# Patient Record
Sex: Female | Born: 1980 | Race: Black or African American | Hispanic: No | Marital: Single | Smoking: Never smoker
Health system: Southern US, Community
[De-identification: ages and names within clinical notes are randomized; demographics above are authoritative.]

## PROBLEM LIST (undated history)

## (undated) DIAGNOSIS — N200 Calculus of kidney: Secondary | ICD-10-CM

## (undated) DIAGNOSIS — R51 Headache: Secondary | ICD-10-CM

## (undated) DIAGNOSIS — R519 Headache, unspecified: Secondary | ICD-10-CM

## (undated) DIAGNOSIS — E7800 Pure hypercholesterolemia, unspecified: Secondary | ICD-10-CM

## (undated) DIAGNOSIS — E119 Type 2 diabetes mellitus without complications: Secondary | ICD-10-CM

## (undated) DIAGNOSIS — N939 Abnormal uterine and vaginal bleeding, unspecified: Secondary | ICD-10-CM

## (undated) DIAGNOSIS — G912 (Idiopathic) normal pressure hydrocephalus: Secondary | ICD-10-CM

## (undated) DIAGNOSIS — B999 Unspecified infectious disease: Secondary | ICD-10-CM

## (undated) DIAGNOSIS — E669 Obesity, unspecified: Secondary | ICD-10-CM

## (undated) DIAGNOSIS — L309 Dermatitis, unspecified: Secondary | ICD-10-CM

## (undated) HISTORY — DX: (Idiopathic) normal pressure hydrocephalus: G91.2

## (undated) HISTORY — DX: Calculus of kidney: N20.0

## (undated) HISTORY — DX: Dermatitis, unspecified: L30.9

## (undated) HISTORY — DX: Obesity, unspecified: E66.9

## (undated) HISTORY — DX: Abnormal uterine and vaginal bleeding, unspecified: N93.9

## (undated) HISTORY — PX: BREAST SURGERY: SHX581

## (undated) HISTORY — DX: Type 2 diabetes mellitus without complications: E11.9

---

## 1999-08-04 ENCOUNTER — Emergency Department (HOSPITAL_COMMUNITY): Admission: EM | Admit: 1999-08-04 | Discharge: 1999-08-04 | Payer: Self-pay | Admitting: *Deleted

## 1999-09-17 ENCOUNTER — Inpatient Hospital Stay (HOSPITAL_COMMUNITY): Admission: AD | Admit: 1999-09-17 | Discharge: 1999-09-17 | Payer: Self-pay | Admitting: *Deleted

## 1999-09-25 ENCOUNTER — Encounter: Admission: RE | Admit: 1999-09-25 | Discharge: 1999-09-25 | Payer: Self-pay | Admitting: Internal Medicine

## 2000-04-24 ENCOUNTER — Emergency Department (HOSPITAL_COMMUNITY): Admission: EM | Admit: 2000-04-24 | Discharge: 2000-04-24 | Payer: Self-pay | Admitting: Emergency Medicine

## 2000-04-24 ENCOUNTER — Encounter: Payer: Self-pay | Admitting: Emergency Medicine

## 2001-10-03 ENCOUNTER — Inpatient Hospital Stay (HOSPITAL_COMMUNITY): Admission: AD | Admit: 2001-10-03 | Discharge: 2001-10-03 | Payer: Self-pay | Admitting: Obstetrics & Gynecology

## 2002-03-25 ENCOUNTER — Emergency Department (HOSPITAL_COMMUNITY): Admission: EM | Admit: 2002-03-25 | Discharge: 2002-03-25 | Payer: Self-pay | Admitting: Emergency Medicine

## 2003-05-17 ENCOUNTER — Emergency Department (HOSPITAL_COMMUNITY): Admission: EM | Admit: 2003-05-17 | Discharge: 2003-05-18 | Payer: Self-pay | Admitting: Emergency Medicine

## 2003-11-25 ENCOUNTER — Emergency Department (HOSPITAL_COMMUNITY): Admission: EM | Admit: 2003-11-25 | Discharge: 2003-11-25 | Payer: Self-pay | Admitting: Emergency Medicine

## 2004-01-12 ENCOUNTER — Emergency Department (HOSPITAL_COMMUNITY): Admission: EM | Admit: 2004-01-12 | Discharge: 2004-01-12 | Payer: Self-pay | Admitting: Family Medicine

## 2004-02-13 ENCOUNTER — Ambulatory Visit (HOSPITAL_COMMUNITY): Admission: RE | Admit: 2004-02-13 | Discharge: 2004-02-13 | Payer: Self-pay | Admitting: Orthopedic Surgery

## 2004-03-14 ENCOUNTER — Encounter: Admission: RE | Admit: 2004-03-14 | Discharge: 2004-05-10 | Payer: Self-pay | Admitting: Orthopedic Surgery

## 2004-06-07 ENCOUNTER — Encounter: Admission: RE | Admit: 2004-06-07 | Discharge: 2004-06-07 | Payer: Self-pay | Admitting: Orthopedic Surgery

## 2005-01-04 ENCOUNTER — Inpatient Hospital Stay (HOSPITAL_COMMUNITY): Admission: AD | Admit: 2005-01-04 | Discharge: 2005-01-04 | Payer: Self-pay | Admitting: Obstetrics and Gynecology

## 2005-01-09 ENCOUNTER — Ambulatory Visit: Payer: Self-pay | Admitting: Obstetrics and Gynecology

## 2005-11-10 ENCOUNTER — Emergency Department (HOSPITAL_COMMUNITY): Admission: EM | Admit: 2005-11-10 | Discharge: 2005-11-10 | Payer: Self-pay | Admitting: Emergency Medicine

## 2006-11-01 ENCOUNTER — Emergency Department (HOSPITAL_COMMUNITY): Admission: EM | Admit: 2006-11-01 | Discharge: 2006-11-01 | Payer: Self-pay | Admitting: Emergency Medicine

## 2006-12-04 ENCOUNTER — Emergency Department (HOSPITAL_COMMUNITY): Admission: EM | Admit: 2006-12-04 | Discharge: 2006-12-04 | Payer: Self-pay | Admitting: Family Medicine

## 2007-05-12 ENCOUNTER — Emergency Department (HOSPITAL_COMMUNITY): Admission: EM | Admit: 2007-05-12 | Discharge: 2007-05-13 | Payer: Self-pay | Admitting: Emergency Medicine

## 2007-05-14 ENCOUNTER — Emergency Department (HOSPITAL_COMMUNITY): Admission: EM | Admit: 2007-05-14 | Discharge: 2007-05-14 | Payer: Self-pay | Admitting: Family Medicine

## 2008-10-20 HISTORY — PX: REDUCTION MAMMAPLASTY: SUR839

## 2009-10-04 ENCOUNTER — Emergency Department (HOSPITAL_COMMUNITY): Admission: EM | Admit: 2009-10-04 | Discharge: 2009-10-04 | Payer: Self-pay | Admitting: Family Medicine

## 2009-10-24 ENCOUNTER — Other Ambulatory Visit: Admission: RE | Admit: 2009-10-24 | Discharge: 2009-10-24 | Payer: Self-pay | Admitting: Family Medicine

## 2009-10-24 ENCOUNTER — Ambulatory Visit: Payer: Self-pay | Admitting: Family Medicine

## 2010-03-06 ENCOUNTER — Encounter (INDEPENDENT_AMBULATORY_CARE_PROVIDER_SITE_OTHER): Payer: Self-pay | Admitting: *Deleted

## 2010-04-08 ENCOUNTER — Ambulatory Visit (HOSPITAL_BASED_OUTPATIENT_CLINIC_OR_DEPARTMENT_OTHER): Admission: RE | Admit: 2010-04-08 | Discharge: 2010-04-08 | Payer: Self-pay | Admitting: *Deleted

## 2010-04-13 ENCOUNTER — Ambulatory Visit: Payer: Self-pay | Admitting: Internal Medicine

## 2010-07-20 HISTORY — PX: BREAST REDUCTION SURGERY: SHX8

## 2010-08-16 ENCOUNTER — Ambulatory Visit (HOSPITAL_BASED_OUTPATIENT_CLINIC_OR_DEPARTMENT_OTHER): Admission: RE | Admit: 2010-08-16 | Discharge: 2010-08-17 | Payer: Self-pay | Admitting: Specialist

## 2010-11-19 NOTE — Letter (Signed)
Summary: New Patient letter  [facility]  [address] [place], [place] [address]   Phone: [phone]  Fax: [phone]       03/06/2010 MRN: [id number]  [facility] [address] [place], [place]  [address]  Dear Ms. Macy,  Welcome to the Gastroenterology Division at [facility].    You are scheduled to see Dr.  Russella Dar on  05-01-10 at 3pm on the 3rd floor at [facility], [address]  We ask that you try to arrive at our office 15 minutes prior to your appointment time to allow for check-in.  We would like you to complete the enclosed self-administered evaluation form prior to your visit and bring it with you on the day of your appointment.  We will review it with you.  Also, please bring a complete list of all your medications or, if you prefer, bring the medication bottles and we will list them.  Please bring your insurance card so that we may make a copy of it.  If your insurance requires a referral to see a specialist, please bring your referral form from your primary care physician.  Co-payments are due at the time of your visit and may be paid by cash, check or credit card.     Your office visit will consist of a consult with your physician (includes a physical exam), any laboratory testing he/she may order, scheduling of any necessary diagnostic testing (e.g. x-ray, ultrasound, CT-scan), and scheduling of a procedure (e.g. Endoscopy, Colonoscopy) if required.  Please allow enough time on your schedule to allow for any/all of these possibilities.    If you cannot keep your appointment, please call [phone] to cancel or reschedule prior to your appointment date.  This allows [place] the opportunity to schedule an appointment for another patient in need of care.  If you do not cancel or reschedule by 5 p.m. the business day prior to your appointment date, you will be charged a $50.00 late cancellation/no-show fee.    Thank you for choosing [facility] for your medical needs.  We appreciate the opportunity to care for you.  Please visit [place] at our website  to learn more about our practice.                     Sincerely,                                                             The Gastroenterology Division

## 2011-01-01 LAB — DIFFERENTIAL
Basophils Relative: 0 % (ref 0–1)
Eosinophils Relative: 2 % (ref 0–5)
Lymphocytes Relative: 60 % — ABNORMAL HIGH (ref 12–46)
Monocytes Absolute: 0.5 10*3/uL (ref 0.1–1.0)
Monocytes Relative: 8 % (ref 3–12)
Neutro Abs: 2.2 10*3/uL (ref 1.7–7.7)

## 2011-01-01 LAB — BASIC METABOLIC PANEL
BUN: 10 mg/dL (ref 6–23)
CO2: 25 mEq/L (ref 19–32)
Calcium: 9.5 mg/dL (ref 8.4–10.5)
Chloride: 105 mEq/L (ref 96–112)
Creatinine, Ser: 0.87 mg/dL (ref 0.4–1.2)
GFR calc Af Amer: >60 mL/min (ref >60)
GFR calc non Af Amer: >60 mL/min (ref >60)
Glucose, Bld: 85 mg/dL (ref 70–99)
Potassium: 4.2 mEq/L (ref 3.5–5.1)
Sodium: 140 mEq/L (ref 135–145)

## 2011-01-01 LAB — CBC
HCT: 41.3 % (ref 36.0–46.0)
Hemoglobin: 13.1 g/dL (ref 12.0–15.0)
MCH: 28 pg (ref 26.0–34.0)
MCHC: 31.7 g/dL (ref 30.0–36.0)
MCV: 88.2 fL (ref 78.0–100.0)
Platelets: 345 10*3/uL (ref 150–400)
RBC: 4.68 MIL/uL (ref 3.87–5.11)
RDW: 12.8 % (ref 11.5–15.5)
WBC: 7.2 10*3/uL (ref 4.0–10.5)

## 2011-01-20 LAB — WET PREP, GENITAL
Clue Cells Wet Prep HPF POC: NONE SEEN
Trich, Wet Prep: NONE SEEN

## 2011-01-20 LAB — POCT URINALYSIS DIP (DEVICE)
Protein, ur: NEGATIVE mg/dL
Specific Gravity, Urine: 1.015 (ref 1.005–1.030)
Urobilinogen, UA: 1 mg/dL (ref 0.0–1.0)
pH: 6.5 (ref 5.0–8.0)

## 2011-01-20 LAB — POCT PREGNANCY, URINE: Preg Test, Ur: NEGATIVE

## 2011-02-19 ENCOUNTER — Other Ambulatory Visit: Payer: Self-pay | Admitting: Family Medicine

## 2011-02-19 ENCOUNTER — Encounter: Payer: Self-pay | Admitting: Family Medicine

## 2011-02-19 ENCOUNTER — Encounter (INDEPENDENT_AMBULATORY_CARE_PROVIDER_SITE_OTHER): Payer: 59 | Admitting: Family Medicine

## 2011-02-19 DIAGNOSIS — Z3009 Encounter for other general counseling and advice on contraception: Secondary | ICD-10-CM

## 2011-02-19 DIAGNOSIS — N6312 Unspecified lump in the right breast, upper inner quadrant: Secondary | ICD-10-CM

## 2011-02-19 DIAGNOSIS — E669 Obesity, unspecified: Secondary | ICD-10-CM

## 2011-02-19 DIAGNOSIS — Z0000 Encounter for general adult medical examination without abnormal findings: Secondary | ICD-10-CM

## 2011-02-20 ENCOUNTER — Other Ambulatory Visit (HOSPITAL_COMMUNITY)
Admission: RE | Admit: 2011-02-20 | Discharge: 2011-02-20 | Disposition: A | Discharge status: Home / Self Care | Payer: 59 | Source: Ambulatory Visit | Attending: Family Medicine | Admitting: Family Medicine

## 2011-02-20 DIAGNOSIS — Z124 Encounter for screening for malignant neoplasm of cervix: Secondary | ICD-10-CM | POA: Insufficient documentation

## 2011-02-21 ENCOUNTER — Other Ambulatory Visit: Payer: 59

## 2011-02-24 ENCOUNTER — Encounter: Payer: Self-pay | Admitting: Family Medicine

## 2011-02-25 ENCOUNTER — Other Ambulatory Visit: Payer: 59

## 2011-03-03 ENCOUNTER — Encounter: Payer: Self-pay | Admitting: Medical

## 2011-03-03 ENCOUNTER — Ambulatory Visit (INDEPENDENT_AMBULATORY_CARE_PROVIDER_SITE_OTHER): Payer: 59 | Admitting: Medical

## 2011-03-03 VITALS — BP 110/78 | HR 80 | Ht 65.0 in | Wt 221.0 lb

## 2011-03-03 DIAGNOSIS — L03113 Cellulitis of right upper limb: Secondary | ICD-10-CM

## 2011-03-03 DIAGNOSIS — B373 Candidiasis of vulva and vagina: Secondary | ICD-10-CM

## 2011-03-03 DIAGNOSIS — IMO0002 Reserved for concepts with insufficient information to code with codable children: Secondary | ICD-10-CM

## 2011-03-03 MED ORDER — FLUCONAZOLE 100 MG PO TABS: ORAL_TABLET | ORAL | Status: AC

## 2011-03-03 MED ORDER — DOXYCYCLINE HYCLATE 50 MG PO CAPS: ORAL_CAPSULE | ORAL | Status: DC

## 2011-03-03 NOTE — Progress Notes (Signed)
Subjective:   Here today with a complaint of a possible insect bite of her right upper arm. She noticed a hard spot on the back of her right upper arm on Saturday, and since then the area has become warm, tender, indurated. She has been using an ice pack but no other relieving factors. She denies prior similar episodes. She does note that her and her mother have both gotten some recent bug bites in general. No other new complaints.   Review of Systems Constitutional: denies fever, chills, sweats Dermatology: +redness and rash right upper arm Gastroenterology: denies abdominal pain, nausea, vomiting, diarrhea Hematology: denies bleeding or bruising problems Musculoskeletal: denies arthralgias, myalgias, joint swelling     Objective:   Physical Exam Gen.: Well-developed, well-nourished, no acute distress, American female, overweight Skin: Right upper arm with a large oval area of warmth and slight swelling that measures 17 cm x 15 cm, no obvious induration or fluctuance. Otherwise skin unremarkable  Lymph nodes: No axillary lymphadenopathy   Assessment & Plan:    Encounter Diagnoses  Name Primary?  . Cellulitis of arm, right Yes  . Yeast vaginitis    Cellulitis-advised warm compresses, prescription for doxycycline today, discussed wound care and signs of worsening infection.  She will call if not improving or worse within the next 24-36 hrs.  Discussed the results of her recent Pap smear from Dr. Lynelle Doctor which were normal except for fungal organisms consistent with Candida. Prescription for Diflucan given today, and she can use a second dose when she finishes the doxycycline if needed.  Return when necessary.

## 2011-03-03 NOTE — Patient Instructions (Signed)
Cellulitis Cellulitis is an infection of the tissue under the skin. The infected area is usually red and tender. This is caused by germs (bacteria). These germs enter the body through cuts or sores. This usually happens in the arms or lower legs.  HOME CARE  Take medicine (antibiotics) the way your doctor told you to. Take it until it is gone.   Only take over-the-counter medicine as told by your doctor.   If the infection is on the arm or leg, keep the limb raised.   Use a warm cloth on the infected area several times a day to:   Help with pain.   Help with healing.   See your doctor for a follow-up visit as told.  GET HELP RIGHT AWAY IF:   You or your child is tired or confused.   You or your child is throwing up (vomiting).   You or your child is having watery poop (diarrhea).   You or your child feels ill and has muscle aches.   You or your child has a temperature by mouth above 101, not controlled by medicine.   Your baby is older than 3 months with a rectal temperature of 102 F (38.9 C) or higher.   Your baby is 59 months old or younger with a rectal temperature of 100.4 F (38 C) or higher.  MAKE SURE YOU:   Understand these instructions.   Will watch this condition.   Will get help right away if you or your child is not doing well or gets worse.  Document Released: 03/24/2008 Document Re-Released: 12/31/2009 [facility] Patient Information 2011 [place], [place].

## 2011-03-06 ENCOUNTER — Other Ambulatory Visit: Payer: 59

## 2011-03-07 NOTE — Group Therapy Note (Signed)
[name], [name]               ACCOUNT NO.:  [id number]   MEDICAL RECORD NO.:  [id number]          PATIENT TYPE:  WOC   LOCATION:  [facility]                   FACILITY:  [facility]   PHYSICIAN:  Argentina Donovan, MD        DATE OF BIRTH:  1980/12/02   DATE OF SERVICE:  01/09/2005                                    CLINIC NOTE   REASON FOR VISIT:  The patient is a 30 year old nulligravida black female  who weighs 208 pounds and is 5 feet 3 inches with a normal blood pressure  who has never had really regular periods but has not had a long period of  amenorrhea. She has very slight hirsutism with acne and a female escutcheon  but no facial hair of note. She has had almost continual spotting or  bleeding since the beginning of January and probably has a polycystic  ovarian syndrome. We have discussed in detail the physiology of  menstruation, as well as polycystic ovaries, and she had been placed when  seen in the MAU with a normal hemoglobin on Yasmin oral contraceptives just  a few days ago which have already started to control her bleeding. We are  going to give her a renewal for that up to a year. A Pap smear was done  today.   EXAMINATION:  The external genitalia is normal, BUS within normal limits.  The vagina is clean and well rugated. The cervix is clean and parous. The  uterus and adnexa could not be outlined because of the habitus of the  patient. She had a negative culture for gonorrhea and chlamydia and her wet  prep was normal as well as her urine in the MAU.   IMPRESSION:  Menstrual irregularity, probably polycystic ovarian syndrome.      PR/MEDQ  D:  01/09/2005  T:  01/09/2005  Job:  [id number]

## 2011-03-12 ENCOUNTER — Telehealth: Payer: Self-pay | Admitting: Family Medicine

## 2011-03-12 ENCOUNTER — Ambulatory Visit
Admission: RE | Admit: 2011-03-12 | Discharge: 2011-03-12 | Disposition: A | Discharge status: Home / Self Care | Payer: 59 | Source: Ambulatory Visit | Attending: Family Medicine | Admitting: Family Medicine

## 2011-03-12 DIAGNOSIS — N6312 Unspecified lump in the right breast, upper inner quadrant: Secondary | ICD-10-CM

## 2011-03-12 DIAGNOSIS — L0390 Cellulitis, unspecified: Secondary | ICD-10-CM

## 2011-03-12 NOTE — Telephone Encounter (Signed)
PT STOPPED BY, CELLULITIS IS BETTER, BUT HAVING OTHER AREAS OF REDNESS AND SWELLING, THEY DON'T HAVE THE HEAT LIKE THE CELLULITIS BUT DO ITCH.  PLEASE CALL PT-LM

## 2011-03-13 MED ORDER — DOXYCYCLINE HYCLATE 100 MG PO TABS: 100.0000 mg | ORAL_TABLET | Freq: Two times a day (BID) | ORAL | Status: AC

## 2011-03-13 NOTE — Telephone Encounter (Signed)
Lets go additional period of time on higher dose.  Please call out Doxycycline 100mg  tablet, 1 tablet po BID x 10 days #20, no refill.  pls put this in as an order as well and close.

## 2011-03-13 NOTE — Telephone Encounter (Signed)
Put in Doxycycline 100 mg 1 po BID x 10 days #20 with 0 refill., sent to pharmacy.    Patient notified. CM, LPN

## 2011-03-14 ENCOUNTER — Telehealth: Payer: Self-pay | Admitting: *Deleted

## 2011-03-14 NOTE — Telephone Encounter (Signed)
Left message for patient explaining mammo results. Instructed her to call with any questions.

## 2011-03-19 ENCOUNTER — Other Ambulatory Visit: Payer: 59

## 2011-05-27 ENCOUNTER — Telehealth: Payer: Self-pay | Admitting: *Deleted

## 2011-05-27 NOTE — Telephone Encounter (Signed)
There is no generic for Yaz.  I am out of town and do not have access to her chart to know if she has done well with a different OCP in the past.  Please ask pt if she has done well with a particular OCP (generic) in the past that she would like to be changed to; and/or if this can wait for my return (she had recent CPE, but prior to EMR).  I believe that at some point insurances won't be able to charge copays (part of the new affordable care act) for birth control, but not exactly sure when/how that goes into effect.

## 2011-05-28 NOTE — Telephone Encounter (Signed)
Left message for pt to return call. CM, LPN

## 2011-05-29 NOTE — Telephone Encounter (Signed)
Spoke with pt regarding birth control.  She has been on a generic in the past but not sure what it was.  Pt also stated that she can wait until you return on Monday to look at her chart.  Thanks, CM, LPN

## 2011-06-02 ENCOUNTER — Telehealth: Payer: Self-pay | Admitting: *Deleted

## 2011-06-02 NOTE — Telephone Encounter (Signed)
No records of other OCP's in chart.  Change to ortho tricyclen, as this is available in generic, and is well tolerated.  If cycles aren't well regulated after 3 months of being on this pill, f/u to discuss other pills.  Keep track of any irregular bleeding on a calendar and bring to visit.

## 2011-06-02 NOTE — Telephone Encounter (Signed)
Left message for patient to return my call re: birth control pills.

## 2011-06-03 ENCOUNTER — Telehealth: Payer: Self-pay | Admitting: Family Medicine

## 2011-06-03 DIAGNOSIS — IMO0001 Reserved for inherently not codable concepts without codable children: Secondary | ICD-10-CM

## 2011-06-03 MED ORDER — NORGESTIM-ETH ESTRAD TRIPHASIC 0.18/0.215/0.25 MG-35 MCG PO TABS: 1.0000 | ORAL_TABLET | Freq: Every day | ORAL | Status: DC

## 2011-06-03 NOTE — Telephone Encounter (Signed)
Called ortho tri-cyclen to [facility] at [phone] per Joselyn Arrow.  CM, LPN

## 2011-06-03 NOTE — Telephone Encounter (Signed)
Pt is aware of message and called out orth tri-cyclen to pharmacy.

## 2011-06-06 ENCOUNTER — Other Ambulatory Visit: Payer: Self-pay | Admitting: *Deleted

## 2011-06-30 ENCOUNTER — Telehealth: Payer: Self-pay | Admitting: *Deleted

## 2011-06-30 NOTE — Telephone Encounter (Signed)
Patient called and states that she has been off and on all month(her menstrual cycle). States that today she is having some pain, no more than usual but she is having sever bleeding, and passing many quarter sized blood clots. She is using super absorbant tampons and changing every two hours with a pad as a backup. She hasn't started bcp's as of yet. Please advise. Thanks.

## 2011-06-30 NOTE — Telephone Encounter (Signed)
OV is recommended. Ensure adequate hydration/drink plenty of fluids

## 2011-06-30 NOTE — Telephone Encounter (Signed)
Left message for patient to schedule OV visit to see Dr.Knapp. Left message offering appointment today at 3:15pm, patient has not yet returned my call.

## 2011-07-02 ENCOUNTER — Encounter: Payer: Self-pay | Admitting: Family Medicine

## 2011-07-02 ENCOUNTER — Ambulatory Visit (INDEPENDENT_AMBULATORY_CARE_PROVIDER_SITE_OTHER): Payer: 59 | Admitting: Family Medicine

## 2011-07-02 VITALS — BP 120/70 | HR 80 | Ht 64.0 in | Wt 219.0 lb

## 2011-07-02 DIAGNOSIS — N920 Excessive and frequent menstruation with regular cycle: Secondary | ICD-10-CM

## 2011-07-02 DIAGNOSIS — Z309 Encounter for contraceptive management, unspecified: Secondary | ICD-10-CM

## 2011-07-02 LAB — POCT HEMOGLOBIN: Hemoglobin: 11.7

## 2011-07-02 MED ORDER — IBUPROFEN 800 MG PO TABS: 800.0000 mg | ORAL_TABLET | Freq: Three times a day (TID) | ORAL | Status: AC | PRN

## 2011-07-02 MED ORDER — NORETHINDRONE-ETH ESTRADIOL 0.5-35 MG-MCG PO TABS: 1.0000 | ORAL_TABLET | Freq: Every day | ORAL | Status: DC

## 2011-07-02 NOTE — Progress Notes (Signed)
Patient presents with complaint of irregular menstrual bleeding, heavy and clotting currently.  Hadn't been on any birth control for about 6 months. Cycles were regular during this time. Early in August, started spotting, and sometimes having thick white discharge, and brown discharge.  She started taking Necon 1-35 BCP's that she had at home, but only took it until the bleeding stopped (just a few days), but then when stopped the pills, the bleeding recurred.  Then she'd restart the pills.  Was taking the pills on and off, and having bleeding on and off.  Last regular period was end of July.  This bleeding/spotting started about 2 weeks later.   She took last pill of OCP's on Friday, and then on Sunday starting bleeding heavily.  Bleeding has been heavy since then, with clotting, and soaking through tampons every 2-3 hours.  Denies feeling light headed or dizzy.  Denies any potential pregnancy--last intercourse was in May.  Having some abdominal pain, cramping; ibuprofen eases it some.  Past Medical History  Diagnosis Date  . Obesity, unspecified   . Menses, irregular   . Contraceptive management   . Eczema     sees dr Terri Piedra    Past Surgical History  Procedure Date  . Breast reduction surgery 07/2010    History   Social History  . Marital Status: Single    Spouse Name: N/A    Number of Children: N/A  . Years of Education: N/A   Occupational History  . Not on file.   Social History Main Topics  . Smoking status: Never Smoker   . Smokeless tobacco: Never Used  . Alcohol Use: No  . Drug Use: No  . Sexually Active: Not on file   Other Topics Concern  . Not on file   Social History Narrative  . No narrative on file   Meds--Necon 1-35 irregularly per HPI  No Known Allergies  ROS: Sweaty and hot and stomach upset this morning (diarrhea).  Denies fevers, nausea, vomiting, current vaginal discharge, urinary complaints, or other concerns  PHYSICAL EXAM: BP 120/70  Pulse 80   Ht 5\' 4"  (1.626 m)  Wt 219 lb (99.338 kg)  BMI 37.59 kg/m2  LMP 06/29/2011 Pleasant obese female in no distress Heart: regular rate and rhythm without murmur Lungs: clear bilaterally Abdomen: soft, no masses.  Mild tenderness across lower abdomen  Hg 11.7  ASSESSMENT/PLAN: 1. Heavy menstrual bleeding  POCT hemoglobin, ibuprofen (ADVIL,MOTRIN) 800 MG tablet  2. Encounter for contraceptive management  norethindrone-ethinyl estradiol (NECON,BREVICON,MODICON) 0.5-35 MG-MCG tablet   Keep well hydrated.  If bleeding doesn't slow down and stop over the next week, then start the birth control pills.  Instructed to take them daily, for the entire pack  (not just for a few days until the bleeding stops).    Use condoms with any new sexual partners.  If doesn't need to start pills right away, but decides to start them in the future, when in a new sexual relationship, reminded to use condoms, educated how/when to start, etc.  F/u if ongoing problems with either abnormal bleeding or pelvic pain.  Rx ibuprofen for cramping/pain.  NSAID precautions reviewed

## 2011-07-02 NOTE — Patient Instructions (Addendum)
Keep yourself well hydrated.  If bleeding doesn't slow down and stop over the next week, then start the birth control pills.  You will need to take them daily, for the entire pack  (not just for a few days until the bleeding stops).    Use condoms with any new sexual partners.  If you don't end up starting the pills right away, make sure you remember to start AFTER your period has BEGUN, and that the first month that you take the pills, the pills are NOT EFFECTIVE in preventing pregnancy  Do not mix prescription anti-inflammatory with other OTC pain medications (tylenol is okay).  Make sure you take with food, and cut back on dose if having upper abdominal pain

## 2011-08-04 LAB — CBC
Platelets: 350
WBC: 10.8 — ABNORMAL HIGH

## 2011-08-04 LAB — BASIC METABOLIC PANEL
BUN: 4 — ABNORMAL LOW
Calcium: 8.6
Creatinine, Ser: 0.88
GFR calc non Af Amer: >60

## 2011-08-04 LAB — POCT CARDIAC MARKERS
CKMB, poc: <1 — ABNORMAL LOW
Myoglobin, poc: 50.1
Operator id: 4661

## 2011-08-04 LAB — D-DIMER, QUANTITATIVE: D-Dimer, Quant: 0.33

## 2011-08-13 ENCOUNTER — Telehealth: Payer: Self-pay | Admitting: *Deleted

## 2011-08-13 NOTE — Telephone Encounter (Signed)
Left message for patient informing her that Dr.Knapp would like her to stay on current pill pack for 3 months before making any changes(as long as she isn't having any other side effects or problems). Instructed patient to call with any further questions.

## 2011-08-13 NOTE — Telephone Encounter (Signed)
Patient called and said that she is currently taking the BCP's that you rx'd for her. Her period is still really heavy and she is still having clots. Should she continue taking for some more time or did you want to change? Please advise.

## 2011-08-13 NOTE — Telephone Encounter (Signed)
I would like for her to stay on current pill pack for 3 months before making any changes (as long as she isn't having other side effects or problems).  Continue to take daily

## 2011-08-21 DIAGNOSIS — G912 (Idiopathic) normal pressure hydrocephalus: Secondary | ICD-10-CM

## 2011-08-21 HISTORY — DX: (Idiopathic) normal pressure hydrocephalus: G91.2

## 2011-09-04 ENCOUNTER — Other Ambulatory Visit: Payer: Self-pay | Admitting: Diagnostic Neuroimaging

## 2011-10-02 ENCOUNTER — Ambulatory Visit
Admission: RE | Admit: 2011-10-02 | Discharge: 2011-10-02 | Disposition: A | Discharge status: Home / Self Care | Payer: 59 | Source: Ambulatory Visit | Attending: Diagnostic Neuroimaging | Admitting: Diagnostic Neuroimaging

## 2011-10-02 ENCOUNTER — Other Ambulatory Visit: Payer: 59

## 2011-10-02 NOTE — Patient Instructions (Signed)
Lumbar Puncture Discharge Instructions ° °1. Go home and rest quietly for the next 24 hours.  It is important to lie flat for the next 24 hours.  Get up only to go to the restroom.  You may lie in the bed or on a couch on your back, your stomach, your left side or your right side.  You may have one pillow under your head.  You may have pillows between your knees while you are on your side or under your knees while you are on your back. ° °2. DO NOT drive today.  Recline the seat as far back as it will go, while still wearing your seat belt, on the way home. ° °3. You may get up to go to the bathroom as needed.  You may sit up for 10 minutes to eat.  You may resume your normal diet and medications unless otherwise indicated. ° °4. The incidence of headache, nausea, or vomiting is about 5% (one in 20 patients).  If you develop a headache, lie flat and drink plenty of fluids until the headache goes away.  Caffeinated beverages may be helpful.  If you develop severe nausea and vomiting or a headache that does not go away with flat bed rest, call [phone]. ° °5. You may resume normal activities after your 24 hours of bed rest is over; however, do not exert yourself strongly or do any heavy lifting tomorrow. ° °6. Call your physician for a follow-up appointment.  The results of your myelogram will be sent directly to your physician by the following day. ° °7. If you have any questions or if complications develop after you arrive home, please call [phone]. ° °Discharge instructions have been explained to the patient.  The patient, or the person responsible for the patient, fully understands these instructions.  °

## 2011-10-20 ENCOUNTER — Ambulatory Visit (INDEPENDENT_AMBULATORY_CARE_PROVIDER_SITE_OTHER): Payer: 59 | Admitting: Medical

## 2011-10-20 ENCOUNTER — Encounter: Payer: Self-pay | Admitting: Medical

## 2011-10-20 VITALS — BP 138/84 | HR 72 | Temp 98.2°F | Ht 64.0 in | Wt 211.0 lb

## 2011-10-20 DIAGNOSIS — R05 Cough: Secondary | ICD-10-CM

## 2011-10-20 DIAGNOSIS — J329 Chronic sinusitis, unspecified: Secondary | ICD-10-CM | POA: Insufficient documentation

## 2011-10-20 DIAGNOSIS — R059 Cough, unspecified: Secondary | ICD-10-CM | POA: Insufficient documentation

## 2011-10-20 MED ORDER — AMOXICILLIN 500 MG PO CAPS: ORAL_CAPSULE | ORAL | Status: DC

## 2011-10-20 NOTE — Progress Notes (Signed)
Stacey Byrd is a 30 y.o. female who presents for 9 day history of cough, sore throat, sinus pressure, sneezing, runny nose, mucoid nasal drainage with some blood mixed in, using NyQuil and daytime congestion medication.  Denies sick contacts.  No other aggravating or relieving factors.  No other c/o.  Past Medical History  Diagnosis Date  . Obesity, unspecified   . Menses, irregular   . Contraceptive management   . Eczema     sees dr Terri Piedra    Objective:   Filed Vitals:   10/20/11 0911  BP: 138/84  Pulse: 72  Temp: 98.2 F (36.8 C)    General appearance: Alert, WD/WN, no distress                             Skin: warm, no rash                           Head: + maxillary sinus tenderness,                            Eyes: conjunctiva normal, corneas clear, PERRLA                            Ears: pearly TMs, external ear canals normal                          Nose: septum midline, turbinates swollen, with erythema and clear discharge             Mouth/throat: MMM, tongue normal, mild pharyngeal erythema                           Neck: supple, no adenopathy, no thyromegaly, nontender                          Heart: RRR, normal S1, S2, no murmurs                         Lungs: CTA bilaterally, no wheezes, rales, or rhonchi      Assessment and Plan:   Encounter Diagnoses  Name Primary?  . Sinusitis Yes  . Cough    Prescription given for Amoxicillin.  Can c/t OTC decongestant for congestion and cough.  Tylenol or Ibuprofen OTC for fever and malaise.  Discussed symptomatic relief, nasal saline, and call or return if worse or not improving in 2-3 days.

## 2011-10-20 NOTE — Patient Instructions (Signed)

## 2011-10-21 DIAGNOSIS — N200 Calculus of kidney: Secondary | ICD-10-CM

## 2011-10-21 HISTORY — DX: Calculus of kidney: N20.0

## 2011-10-27 ENCOUNTER — Telehealth: Payer: Self-pay | Admitting: Family Medicine

## 2011-10-27 ENCOUNTER — Other Ambulatory Visit: Payer: Self-pay | Admitting: Medical

## 2011-10-27 MED ORDER — FLUCONAZOLE 150 MG PO TABS: 150.0000 mg | ORAL_TABLET | Freq: Once | ORAL | Status: AC

## 2011-10-27 NOTE — Telephone Encounter (Signed)
Diflucan sent for yeast infection.

## 2011-10-27 NOTE — Telephone Encounter (Signed)
Left message on pt voicemail stating it was sent in

## 2012-02-16 ENCOUNTER — Emergency Department (HOSPITAL_COMMUNITY): Payer: 59

## 2012-02-16 ENCOUNTER — Encounter (HOSPITAL_COMMUNITY): Payer: Self-pay | Admitting: Emergency Medicine

## 2012-02-16 ENCOUNTER — Emergency Department (HOSPITAL_COMMUNITY)
Admission: EM | Admit: 2012-02-16 | Discharge: 2012-02-16 | Disposition: A | Discharge status: Home / Self Care | Payer: 59 | Attending: Emergency Medicine | Admitting: Emergency Medicine

## 2012-02-16 DIAGNOSIS — R109 Unspecified abdominal pain: Secondary | ICD-10-CM | POA: Insufficient documentation

## 2012-02-16 DIAGNOSIS — N201 Calculus of ureter: Secondary | ICD-10-CM

## 2012-02-16 DIAGNOSIS — R630 Anorexia: Secondary | ICD-10-CM | POA: Insufficient documentation

## 2012-02-16 HISTORY — DX: Headache, unspecified: R51.9

## 2012-02-16 HISTORY — DX: Headache: R51

## 2012-02-16 LAB — LIPASE, BLOOD: Lipase: 21 U/L (ref 11–59)

## 2012-02-16 LAB — COMPREHENSIVE METABOLIC PANEL
BUN: 10 mg/dL (ref 6–23)
Calcium: 8.9 mg/dL (ref 8.4–10.5)
Creatinine, Ser: 1.15 mg/dL — ABNORMAL HIGH (ref 0.50–1.10)
GFR calc Af Amer: 73 mL/min — ABNORMAL LOW (ref >90)
Glucose, Bld: 107 mg/dL — ABNORMAL HIGH (ref 70–99)
Total Protein: 7.6 g/dL (ref 6.0–8.3)

## 2012-02-16 LAB — CBC
Hemoglobin: 12.8 g/dL (ref 12.0–15.0)
MCH: 27.8 pg (ref 26.0–34.0)
MCHC: 31.8 g/dL (ref 30.0–36.0)
MCV: 87.4 fL (ref 78.0–100.0)
RBC: 4.61 MIL/uL (ref 3.87–5.11)

## 2012-02-16 LAB — DIFFERENTIAL
Eosinophils Absolute: 0.1 10*3/uL (ref 0.0–0.7)
Eosinophils Relative: 1 % (ref 0–5)
Lymphs Abs: 4.2 10*3/uL — ABNORMAL HIGH (ref 0.7–4.0)
Monocytes Relative: 5 % (ref 3–12)

## 2012-02-16 LAB — URINALYSIS, ROUTINE W REFLEX MICROSCOPIC
Bilirubin Urine: NEGATIVE
Nitrite: NEGATIVE
Specific Gravity, Urine: 1.029 (ref 1.005–1.030)
Urobilinogen, UA: 1 mg/dL (ref 0.0–1.0)

## 2012-02-16 LAB — URINE MICROSCOPIC-ADD ON

## 2012-02-16 MED ORDER — ONDANSETRON 8 MG PO TBDP: ORAL_TABLET | ORAL | Status: AC

## 2012-02-16 MED ORDER — HYDROMORPHONE HCL PF 1 MG/ML IJ SOLN
1.0000 mg | Freq: Once | INTRAMUSCULAR | Status: AC
  Administered 2012-02-16: 1 mg via INTRAVENOUS
  Filled 2012-02-16: qty 1

## 2012-02-16 MED ORDER — HYDROCODONE-ACETAMINOPHEN 5-325 MG PO TABS: 2.0000 | ORAL_TABLET | ORAL | Status: AC | PRN

## 2012-02-16 MED ORDER — SODIUM CHLORIDE 0.9 % IV BOLUS (SEPSIS)
1000.0000 mL | Freq: Once | INTRAVENOUS | Status: AC
  Administered 2012-02-16: 1000 mL via INTRAVENOUS

## 2012-02-16 MED ORDER — ONDANSETRON HCL 4 MG/2ML IJ SOLN
4.0000 mg | Freq: Once | INTRAMUSCULAR | Status: AC
  Administered 2012-02-16: 4 mg via INTRAVENOUS
  Filled 2012-02-16: qty 2

## 2012-02-16 MED ORDER — DICYCLOMINE HCL 10 MG PO CAPS
20.0000 mg | ORAL_CAPSULE | Freq: Once | ORAL | Status: AC
  Administered 2012-02-16: 20 mg via ORAL
  Filled 2012-02-16: qty 2

## 2012-02-16 MED ORDER — MORPHINE SULFATE 4 MG/ML IJ SOLN
4.0000 mg | Freq: Once | INTRAMUSCULAR | Status: AC
  Administered 2012-02-16: 4 mg via INTRAVENOUS
  Filled 2012-02-16: qty 1

## 2012-02-16 NOTE — ED Provider Notes (Signed)
Medical screening examination/treatment/procedure(s) were performed by non-physician practitioner and as supervising physician I was immediately available for consultation/collaboration.  Eureka Valdes, MD 02/16/12 1552 

## 2012-02-16 NOTE — ED Notes (Signed)
Patient transported to CT 

## 2012-02-16 NOTE — ED Provider Notes (Signed)
  Physical Exam  BP 120/54  Pulse 82  Temp(Src) 98.1 F (36.7 C) (Oral)  Resp 18  Ht 5\' 5"  (1.651 m)  Wt 205 lb 12.8 oz (93.35 kg)  BMI 34.25 kg/m2  SpO2 100%  LMP 12/18/2011  Physical Exam  ED Course  Procedures  Patient is feeling better following pain medications. She is advised to return here as needed. Told to follow up with her doctor about the area under her breast which is being followed by them. Also referred to urology. I spoke with her PCP about the area under her breast and they will follow her up in the office.  Carlyle Dolly, PA-C 02/16/12 1148

## 2012-02-16 NOTE — ED Provider Notes (Signed)
History     CSN: [id number]  Arrival date & time 02/16/12  4098   First MD Initiated Contact with Patient 02/16/12 [phone]      Chief Complaint  Patient presents with  . Abdominal Pain    HPI  History provided by the patient. Patient is a 31 year old female with no significant past medical history who presents with complaints of sharp abdominal pains that began last night. Patient states that pain began in left upper quadrant and were very intense and sharp. Symptoms lasted one to 2 hours. Patient was then pain free for 1 hour with return of symptoms around 11 PM. Symptoms weren't been in the right side of the abdomen could have been persistent. Patient did attempt to treat her pain with ibuprofen without success. She denies any other aggravating or alleviating factors. She denies similar symptoms previously. Patient has no significant history of surgery of the abdomen. Patient reports slight decrease in appetite. She denies any associated fever, chills, sweats, vomiting, diarrhea, constipation, dysuria, hematuria, urinary frequency, vaginal bleeding or vaginal discharge. She has abnormal menstrual periods and reports last menstruation in February. Patient currently on birth control pills. Patient does report family history of Crohn's disease. Patient also states that she has had some similar symptoms in the past briefly at times has wondered if she may have symptoms for Crohn's. She has never been evaluated. She denies any rectal bleeding, diarrhea or constipation symptoms.    Past Medical History  Diagnosis Date  . Obesity, unspecified   . Menses, irregular   . Contraceptive management   . Eczema     sees dr Terri Piedra  . Head ache     Past Surgical History  Procedure Date  . Breast reduction surgery 07/2010    No family history on file.  History  Substance Use Topics  . Smoking status: Never Smoker   . Smokeless tobacco: Never Used  . Alcohol Use: Yes     occasional    OB  History    Grav Para Term Preterm Abortions TAB SAB Ect Mult Living                  Review of Systems  Constitutional: Positive for appetite change. Negative for fever and chills.  Respiratory: Negative for shortness of breath.   Cardiovascular: Negative for chest pain.  Gastrointestinal: Positive for abdominal pain. Negative for vomiting, diarrhea and constipation.  Genitourinary: Negative for dysuria, frequency, hematuria, flank pain, vaginal bleeding, vaginal discharge and pelvic pain.    Allergies  Review of patient's allergies indicates no known allergies.  Home Medications   Current Outpatient Rx  Name Route Sig Dispense Refill  . ACETAZOLAMIDE ER 500 MG PO CP12 Oral Take 500 mg by mouth 2 (two) times daily.    Janetta Hora ESTRADIOL 0.5-35 MG-MCG PO TABS Oral Take 1 tablet by mouth daily. 1 Package 7    BP 129/74  Pulse 87  Temp(Src) 98.1 F (36.7 C) (Oral)  Resp 18  Ht 5\' 5"  (1.651 m)  Wt 205 lb 12.8 oz (93.35 kg)  BMI 34.25 kg/m2  SpO2 100%  LMP 12/18/2011  Physical Exam  Nursing note and vitals reviewed. Constitutional: She is oriented to person, place, and time. She appears well-developed and well-nourished. No distress.  HENT:  Head: Normocephalic and atraumatic.  Cardiovascular: Normal rate and regular rhythm.   Pulmonary/Chest: Effort normal and breath sounds normal. No respiratory distress. She has no wheezes. She has no rales.  Abdominal: Soft. She exhibits  no distension. There is tenderness. There is no rebound and no guarding.       Diffuse abdominal tenderness.  Genitourinary:       No CVA tenderness.  Neurological: She is alert and oriented to person, place, and time.  Skin: Skin is warm and dry. No rash noted.  Psychiatric: She has a normal mood and affect. Her behavior is normal.    ED Course  Procedures   Results for orders placed during the hospital encounter of 02/16/12  CBC      Component Value Range   WBC 10.0  4.0 - 10.5  (K/uL)   RBC 4.61  3.87 - 5.11 (MIL/uL)   Hemoglobin 12.8  12.0 - 15.0 (g/dL)   HCT 19.1  47.8 - 29.5 (%)   MCV 87.4  78.0 - 100.0 (fL)   MCH 27.8  26.0 - 34.0 (pg)   MCHC 31.8  30.0 - 36.0 (g/dL)   RDW 62.1  30.8 - 65.7 (%)   Platelets 384  150 - 400 (K/uL)  DIFFERENTIAL      Component Value Range   Neutrophils Relative 52  43 - 77 (%)   Neutro Abs 5.2  1.7 - 7.7 (K/uL)   Lymphocytes Relative 42  12 - 46 (%)   Lymphs Abs 4.2 (*) 0.7 - 4.0 (K/uL)   Monocytes Relative 5  3 - 12 (%)   Monocytes Absolute 0.5  0.1 - 1.0 (K/uL)   Eosinophils Relative 1  0 - 5 (%)   Eosinophils Absolute 0.1  0.0 - 0.7 (K/uL)   Basophils Relative 0  0 - 1 (%)   Basophils Absolute 0.0  0.0 - 0.1 (K/uL)  COMPREHENSIVE METABOLIC PANEL      Component Value Range   Sodium 137  135 - 145 (mEq/L)   Potassium 3.8  3.5 - 5.1 (mEq/L)   Chloride 110  96 - 112 (mEq/L)   CO2 16 (*) 19 - 32 (mEq/L)   Glucose, Bld 107 (*) 70 - 99 (mg/dL)   BUN 10  6 - 23 (mg/dL)   Creatinine, Ser 8.46 (*) 0.50 - 1.10 (mg/dL)   Calcium 8.9  8.4 - 96.2 (mg/dL)   Total Protein 7.6  6.0 - 8.3 (g/dL)   Albumin 3.8  3.5 - 5.2 (g/dL)   AST 16  0 - 37 (U/L)   ALT 11  0 - 35 (U/L)   Alkaline Phosphatase 89  39 - 117 (U/L)   Total Bilirubin 0.2 (*) 0.3 - 1.2 (mg/dL)   GFR calc non Af Amer 63 (*) >90 (mL/min)   GFR calc Af Amer 73 (*) >90 (mL/min)  LIPASE, BLOOD      Component Value Range   Lipase 21  11 - 59 (U/L)      1. Abdominal pain       MDM  4:50 AM patient seen and evaluated. Patient no acute distress.  5:40 a.m. patient with slightly low CO2 and mildly increased creatinine, this is thought most likely secondary to Diamox use the patient began at the first of this year.  Pt discussed with Attending Physician.  Pt also discussed with Ebbie Ridge PA-C.  UA pending.  They will reassess pt after treatments.  Pt with no peritoneal signs.  Pt does have family hx of crohn's and could possible have some similar condition or  IBS. Will plan to give GI follow up.    Angus Seller, [place] 02/16/12 [phone]

## 2012-02-16 NOTE — Discharge Instructions (Signed)
Follow up with your PCP about the area around your R breast. Also call the urologist for a follow up appointment. Return here for any worsening in your condition.

## 2012-02-16 NOTE — ED Notes (Signed)
Pt c/o RLQ pain onset yesterday, pain worse with walking and standing upright. Nausea, denies v/d.

## 2012-02-16 NOTE — ED Provider Notes (Signed)
Medical screening examination/treatment/procedure(s) were conducted as a shared visit with non-physician practitioner(s) and myself.  I personally evaluated the patient during the encounter.  Pt with onset of LUQ and Rsided abd pain starting last night, no prior h/o same.  Abd soft, diffusely tender.  Urine noted to have hematuria, pt denies being on her menses.  Will check ct abd/pelvis renal stone protocol.  Expect patient will be able to be d/c home with outpt f/u, gi and pcm.  Olivia Mackie, MD 02/16/12 [phone]

## 2012-02-24 ENCOUNTER — Other Ambulatory Visit: Payer: Self-pay | Admitting: Family Medicine

## 2012-02-24 NOTE — Telephone Encounter (Signed)
Looks like she is due for CPE/pap--none is scheduled.  Please schedule, and authorize refills just until appt.

## 2012-02-24 NOTE — Telephone Encounter (Signed)
Send this on to Dr. Lynelle Doctor

## 2012-02-24 NOTE — Telephone Encounter (Signed)
Is this ok?

## 2012-02-24 NOTE — Telephone Encounter (Signed)
I believe this is your pt.

## 2012-02-26 NOTE — Telephone Encounter (Signed)
Left messages for patient to call and schedule CPE.

## 2012-07-28 ENCOUNTER — Ambulatory Visit: Payer: 59 | Admitting: Family Medicine

## 2012-07-29 ENCOUNTER — Ambulatory Visit (INDEPENDENT_AMBULATORY_CARE_PROVIDER_SITE_OTHER): Payer: 59 | Admitting: Family Medicine

## 2012-07-29 ENCOUNTER — Ambulatory Visit: Payer: 59 | Admitting: Family Medicine

## 2012-07-29 ENCOUNTER — Encounter: Payer: Self-pay | Admitting: Family Medicine

## 2012-07-29 VITALS — BP 122/78 | HR 88 | Temp 98.4°F | Ht 64.0 in | Wt 205.0 lb

## 2012-07-29 DIAGNOSIS — R350 Frequency of micturition: Secondary | ICD-10-CM

## 2012-07-29 DIAGNOSIS — N760 Acute vaginitis: Secondary | ICD-10-CM

## 2012-07-29 LAB — POCT WET PREP (WET MOUNT)

## 2012-07-29 LAB — POCT URINALYSIS DIPSTICK
Bilirubin, UA: NEGATIVE
Blood, UA: NEGATIVE
Glucose, UA: NEGATIVE
Nitrite, UA: NEGATIVE

## 2012-07-29 MED ORDER — FLUCONAZOLE 150 MG PO TABS: 150.0000 mg | ORAL_TABLET | Freq: Once | ORAL | Status: DC

## 2012-07-29 NOTE — Patient Instructions (Signed)
Take the yeast medication today.  Repeat in 1 week if needed (or in future if you develop similar white, itchy vaginal discharge).  Call next week if vaginal discharge hasn't resolved (ie if still have odorous discharge)--then can treat for the bacterial vaginosis.  Your urine was sent for culture, and we will contact you if you need to start an antibiotic.

## 2012-07-29 NOTE — Progress Notes (Signed)
Chief Complaint  Patient presents with  . Vaginal Discharge    white and itchy discharge x several weeks. Also some urinary frequency. Pt declined flu vaccine. UA showed 2+ leuks.   HPI:  On and off for the last month she has noticed increased urinary frequency, now waking up once a night to void.  In the past couple of weeks has noticed increased mucusy vaginal discharge, and some thick white discharge.  Some vaginal itching.  Sometimes has an odor.  Discharge is white and creamy, not discolored.  Denies any rash, external itching.  Not currently in a sexual relationship.  Last intercourse was in July, used condoms "most" of the time. Denies any pelvic pain, fevers.  Having some flank pain, but really mostly in the lower back.    Recent kidney stone, a few months ago. Denies hematuria, flank pain.  Past Medical History  Diagnosis Date  . Obesity, unspecified   . Menses, irregular   . Contraceptive management   . Eczema     sees dr Terri Piedra  . Head ache   . 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 Kidney stone 2013    [facility]   Past Surgical History  Procedure Date  . Breast reduction surgery 07/2010   History   Social History  . Marital Status: Single    Spouse Name: N/A    Number of Children: N/A  . 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 Smoking status: Never Smoker   . Smokeless tobacco: Never Used  . Alcohol Use: Yes     occasional  . Drug Use: No  . Sexually Active: Not Currently   Other Topics Concern  . Not on file   Social History Narrative  . No narrative on file   Current Outpatient Prescriptions on File Prior to Visit  Medication Sig Dispense Refill  . acetaZOLAMIDE (DIAMOX) 500 MG capsule Take 500 mg by mouth 2 (two) times daily.      Marland Kitchen NECON 0.5/35, 28, 0.5-35 MG-MCG tablet TAKE 1 TABLET BY MOUTH DAILY.  28 tablet  0   No Known Allergies  ROS:  Denies nausea, vomiting, diarrhea, skin rashes, fevers, sore throat, URI symptoms, chest pain, shortness of breath.  See HPI  PHYSICAL EXAM: BP 122/78  Pulse 88  Temp 98.4 F (36.9 C) (Oral)  Ht 5\' 4"  (1.626 m)  Wt 205 lb (92.987 kg)  BMI 35.19 kg/m2  LMP 07/12/2012  Well developed, pleasant female in no distress No CVA tenderness or spinal tenderness Heart: regular rate and rhythm without murmur Lungs: clear bilaterally Abdomen: soft, mild suprapubic tenderness, no mass or organomegaly External genitalia: normal without lesions.  BUS and vagina normal, with small-mod amount of white discharge, no odor.  Cervix appears normal without lesions.  No cervical motion tenderness.  Mildly tender over bladder anteriorly, and over bilateral adnexa.  No rebound or guarding Skin: no rash  Urine dip 2+ leuks  KOH--+yeast Wet prep--many squams, rare PMN's, +bacterial.  Only rare clue cell No trichomonas  ASSESSMENT/PLAN: 1. Urinary frequency  POCT Urinalysis Dipstick, Urine culture  2. Vaginitis and vulvovaginitis, unspecified  GC/Chlamydia Amp Probe, Genital, POCT Wet Prep ([place]), fluconazole (DIFLUCAN) 150 MG tablet   Urinary frequency--2+ leuks may be contaminant from vaginal discharge, and frequency may be related to increased fluid intake since kidney stone.  Send for urine culture,  and treat only if + for infection.  Yeast vaginitis--treat with diflucan. Given #2, to use the second one in a week, if needed.  If vaginal discharge/symptoms do not improve, then call back to treat for BV.  Risks and side effects of all meds reviewed, including metronidazole.  Declines flu shot

## 2012-07-30 LAB — GC/CHLAMYDIA PROBE AMP, GENITAL: GC Probe Amp, Genital: NEGATIVE

## 2012-07-31 LAB — URINE CULTURE
Colony Count: NO GROWTH
Organism ID, Bacteria: NO GROWTH

## 2012-10-08 ENCOUNTER — Ambulatory Visit (INDEPENDENT_AMBULATORY_CARE_PROVIDER_SITE_OTHER): Payer: 59 | Admitting: Medical

## 2012-10-08 ENCOUNTER — Encounter: Payer: Self-pay | Admitting: Medical

## 2012-10-08 VITALS — BP 110/80 | HR 80 | Temp 98.5°F | Resp 16 | Wt 210.0 lb

## 2012-10-08 DIAGNOSIS — J029 Acute pharyngitis, unspecified: Secondary | ICD-10-CM

## 2012-10-08 DIAGNOSIS — J040 Acute laryngitis: Secondary | ICD-10-CM

## 2012-10-08 LAB — POCT RAPID STREP A (OFFICE): Rapid Strep A Screen: NEGATIVE

## 2012-10-08 NOTE — Progress Notes (Signed)
Subjective:  Stacey Byrd is a 31 y.o. female who presents for evaluation of sore throat.  Symptoms began yesterday with sore throat, but awoke this morning with no voice.  She reports bad sore throat, burning feeling in throat, hurts to swallow, cough, or take deep breath.  Doesn't want to eat due to the throat pain.  Has some cough, but nonproductive.  Denies fever, no SOB, wheezing, NVD, no runny nose.  No chills.  She does have some aches in back.      Past Medical History  Diagnosis Date  . Obesity, unspecified   . Menses, irregular   . Contraceptive management   . Eczema     sees dr Terri Piedra  . Head ache   . 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 Kidney stone 2013    [facility]   ROS as HPI  Objective:      Filed Vitals:   10/08/12 1204  BP: 110/80  Pulse: 80  Temp: 98.5 F (36.9 C)  Resp: 16    General appearance: no distress, WD/WN HEENT: normocephalic, conjunctiva/corneas normal, sclerae anicteric, nares patent, no discharge or erythema, pharynx with erythema, no exudate.  Oral cavity: MMM, no lesions  Neck: supple, tender generalized, no lymphadenopathy, no thyromegaly Heart: RRR, normal S1, S2, no murmurs Lungs: CTA bilaterally, no wheezes, rhonchi, or rales   Laboratory Strep test done. Results:negative.    Assessment and Plan:   Encounter Diagnoses  Name Primary?  . Laryngitis Yes  . Sore throat    Advised that symptoms and exam suggest a viral etiology.  Discussed symptomatic treatment including salt water gargles, warm fluids, rest, hydrate well, can use over-the-counter Ibuprofen for throat pain, fever, or malaise. If worse or not improving within 2-3 days, call or return.

## 2013-01-20 ENCOUNTER — Other Ambulatory Visit: Payer: Self-pay | Admitting: Family Medicine

## 2013-01-20 ENCOUNTER — Other Ambulatory Visit: Payer: Self-pay | Admitting: Diagnostic Neuroimaging

## 2013-01-21 NOTE — Telephone Encounter (Signed)
Is this ok?

## 2013-01-21 NOTE — Telephone Encounter (Signed)
Deny (hasn't been filled since 02/2012 and hasn't had CPE, with none scheduled)

## 2013-02-10 ENCOUNTER — Encounter: Payer: Self-pay | Admitting: Family Medicine

## 2013-02-10 ENCOUNTER — Other Ambulatory Visit (HOSPITAL_COMMUNITY)
Admission: RE | Admit: 2013-02-10 | Discharge: 2013-02-10 | Disposition: A | Discharge status: Home / Self Care | Payer: 59 | Source: Ambulatory Visit | Attending: Family Medicine | Admitting: Family Medicine

## 2013-02-10 ENCOUNTER — Ambulatory Visit (INDEPENDENT_AMBULATORY_CARE_PROVIDER_SITE_OTHER): Payer: 59 | Admitting: Family Medicine

## 2013-02-10 VITALS — BP 118/78 | HR 80 | Ht 64.0 in | Wt 215.0 lb

## 2013-02-10 DIAGNOSIS — Z8349 Family history of other endocrine, nutritional and metabolic diseases: Secondary | ICD-10-CM

## 2013-02-10 DIAGNOSIS — Z01419 Encounter for gynecological examination (general) (routine) without abnormal findings: Secondary | ICD-10-CM | POA: Insufficient documentation

## 2013-02-10 DIAGNOSIS — Z13220 Encounter for screening for lipoid disorders: Secondary | ICD-10-CM

## 2013-02-10 DIAGNOSIS — Z8342 Family history of familial hypercholesterolemia: Secondary | ICD-10-CM

## 2013-02-10 DIAGNOSIS — Z309 Encounter for contraceptive management, unspecified: Secondary | ICD-10-CM

## 2013-02-10 DIAGNOSIS — R5381 Other malaise: Secondary | ICD-10-CM

## 2013-02-10 DIAGNOSIS — N926 Irregular menstruation, unspecified: Secondary | ICD-10-CM

## 2013-02-10 DIAGNOSIS — Z0000 Encounter for general adult medical examination without abnormal findings: Secondary | ICD-10-CM

## 2013-02-10 DIAGNOSIS — Z1151 Encounter for screening for human papillomavirus (HPV): Secondary | ICD-10-CM | POA: Insufficient documentation

## 2013-02-10 DIAGNOSIS — R5383 Other fatigue: Secondary | ICD-10-CM

## 2013-02-10 DIAGNOSIS — IMO0001 Reserved for inherently not codable concepts without codable children: Secondary | ICD-10-CM

## 2013-02-10 LAB — POCT URINALYSIS DIPSTICK
Blood, UA: NEGATIVE
Nitrite, UA: NEGATIVE
Urobilinogen, UA: NEGATIVE
pH, UA: 5

## 2013-02-10 MED ORDER — NORGESTIM-ETH ESTRAD TRIPHASIC 0.18/0.215/0.25 MG-35 MCG PO TABS: 1.0000 | ORAL_TABLET | Freq: Every day | ORAL | Status: DC

## 2013-02-10 NOTE — Patient Instructions (Addendum)
HEALTH MAINTENANCE RECOMMENDATIONS:  It is recommended that you get at least 30 minutes of aerobic exercise at least 5 days/week (for weight loss, you may need as much as 60-90 minutes). This can be any activity that gets your heart rate up. This can be divided in 10-15 minute intervals if needed, but try and build up your endurance at least once a week.  Weight bearing exercise is also recommended twice weekly.  Eat a healthy diet with lots of vegetables, fruits and fiber.  "Colorful" foods have a lot of vitamins (ie green vegetables, tomatoes, red peppers, etc).  Limit sweet tea, regular sodas and alcoholic beverages, all of which has a lot of calories and sugar.  Up to 1 alcoholic drink daily may be beneficial for women (unless trying to lose weight, watch sugars).  Drink a lot of water.  Calcium recommendations are 1200-1500 mg daily (1500 mg for postmenopausal women or women without ovaries), and vitamin D 1000 IU daily.  This should be obtained from diet and/or supplements (vitamins), and calcium should not be taken all at once, but in divided doses.  Monthly self breast exams and yearly mammograms for women over the age of 46 is recommended.  Sunscreen of at least SPF 30 should be used on all sun-exposed parts of the skin when outside between the hours of 10 am and 4 pm (not just when at beach or pool, but even with exercise, golf, tennis, and yard work!)  Use a sunscreen that says "broad spectrum" so it covers both UVA and UVB rays, and make sure to reapply every 1-2 hours.  Remember to change the batteries in your smoke detectors when changing your clock times in the spring and fall.  Use your seat belt every time you are in a car, and please drive safely and not be distracted with cell phones and texting while driving.  Check with [facility] re: date and type of last tetanus shot (Td vs TdaP) and fax or call us with this info

## 2013-02-10 NOTE — Progress Notes (Signed)
Chief Complaint  Patient presents with  . fasting physical with pap    fasting physical with pap, eye exam done back in september   Stacey Byrd is a 32 y.o. female who presents for a complete physical.  She has the following concerns:  She has been off of birth control pills for about a year.  She would like to restart pills due to irregular menses.  LMP was in February.  She is not in a sexual relationship currently, last sex was in March (used condoms). She reports this was with same partner as when she had STD screen done in October, and she declines repeat STD testing.  She denies any vaginal discharge, odor, itch.   She complains of swelling/mass on right side, lateral to breast, below axilla, that sometime her bra gets stuck on.  Has been present since her breast reduction surgery.  Nontender, no change.   There is no immunization history on file for this patient. Pt reports having had tetanus within 10 years.  She worked at [employer] from 2004-2008, and believes she got it there Doesn't get yearly flu shots Last Pap smear: 02/2011 Last mammogram: never Last colonoscopy: never Last DEXA: never Dentist: once yearly Ophtho: yearly Exercise: none  Past Medical History  Diagnosis Date  . Obesity, unspecified   . Menses, irregular   . Contraceptive management   . Eczema     sees dr Terri Piedra  . Head ache   . 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 Kidney stone 2013    [facility]    Past Surgical History  Procedure Laterality Date  . Breast reduction surgery  07/2010    History   Social History  . Marital Status: Single    Spouse Name: N/A    Number of Children: N/A  . 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 Smoking status: Never Smoker   . Smokeless tobacco: Never Used  . Alcohol Use: Yes     Comment: occasional  . Drug Use: No  . Sexually Active: Not  Currently   Other Topics Concern  . Not on file   Social History Narrative   Single, lives alone, no pets    Family History  Problem Relation Age of Onset  . Diabetes Mother   . Hypertension Mother   . Gout Mother   . Crohn's disease Mother   . Osteoporosis Mother   . Kidney disease Mother     mild, related to DM  . Hyperlipidemia Mother   . Stroke Father 11  . Hypertension Father   . Seizures Father     related to stroke  . Hyperlipidemia Father   . Kidney Stones Brother   . Hyperlipidemia Maternal Grandmother   . Cancer Neg Hx   . Heart disease Neg Hx    Meds:  None currently (has been out of diamox from neuro for about a month).  No Known Allergies  ROS:  The patient denies anorexia, fever, weight changes, vision changes, decreased hearing, ear pain, sore throat, breast concerns, chest pain, palpitations, dizziness, syncope, dyspnea on exertion, cough, swelling, nausea, vomiting, diarrhea, constipation, abdominal pain, melena, hematochezia, indigestion/heartburn, hematuria, incontinence, dysuria, vaginal discharge, odor or itch, genital lesions, joint pains, numbness, tingling, weakness, tremor, suspicious skin lesions, depression, anxiety, abnormal bleeding/bruising, or enlarged lymph nodes. +headaches since running out of diamox.  Denies  vision changes, balance problems or other neuro symptoms.  PHYSICAL EXAM:  BP 118/78  Pulse 80  Ht 5\' 4"  (1.626 m)  Wt 215 lb (97.523 kg)  BMI 36.89 kg/m2  General Appearance:    Alert, cooperative, no distress, appears stated age  Head:    Normocephalic, without obvious abnormality, atraumatic  Eyes:    PERRL, conjunctiva/corneas clear, EOM's intact, fundi    benign  Ears:    Normal TM's and external ear canals  Nose:   Nares normal, mucosa normal, no drainage or sinus   tenderness  Throat:   Lips, mucosa, and tongue normal; teeth and gums normal  Neck:   Supple, no lymphadenopathy;  thyroid:  no    enlargement/tenderness/nodules; no carotid   bruit or JVD  Back:    Spine nontender, no curvature, ROM normal, no CVA     tenderness  Lungs:     Clear to auscultation bilaterally without wheezes, rales or     ronchi; respirations unlabored  Chest Wall:    No tenderness or deformity   Heart:    Regular rate and rhythm, S1 and S2 normal, no murmur, rub   or gallop  Breast Exam:    No tenderness, masses, or nipple discharge or inversion.      No axillary lymphadenopathy.  Well healed surgical scars.  Asymmetry at lateral chest wall, larger on right (soft tissue, fat)  Abdomen:     Soft, non-tender, nondistended, normoactive bowel sounds,    no masses, no hepatosplenomegaly  Genitalia:    Normal external genitalia without lesions.  BUS and vagina normal; cervix without lesions, or cervical motion tenderness. No abnormal vaginal discharge, just small amount of white discharge, fairly thin.  Uterus and adnexa not enlarged, nontender, no masses.  Pap performed  Rectal:    Not performed due to age<40 and no related complaints  Extremities:   No clubbing, cyanosis or edema  Pulses:   2+ and symmetric all extremities  Skin:   Skin color, texture, turgor normal, no rashes or lesions  Lymph nodes:   Cervical, supraclavicular, and axillary nodes normal  Neurologic:   CNII-XII intact, normal strength, sensation and gait; reflexes 2+ and symmetric throughout          Psych:   Normal mood, affect, hygiene and grooming.     ASSESSMENT/PLAN:  Routine general medical examination at a health care facility - Plan: Urinalysis Dipstick, Comprehensive metabolic panel, CBC with Differential, Lipid panel, Vitamin D 25 hydroxy, TSH, HIV Antibody, Cytology - PAP [place]  Irregular menses - Plan: CBC with Differential, TSH, Norgestimate-Ethinyl Estradiol Triphasic 0.18/0.215/0.25 MG-35 MCG tablet, POCT urine pregnancy  Other malaise and fatigue - Plan: Comprehensive metabolic panel, CBC with Differential, Vitamin D  25 hydroxy, TSH  Screening for lipoid disorders - Plan: Lipid panel  Family history of high cholesterol - Plan: Lipid panel  Birth control - Plan: Norgestimate-Ethinyl Estradiol Triphasic 0.18/0.215/0.25 MG-35 MCG tablet, POCT urine pregnancy  Check with [facility] re: date and type of last tetanus shot (Td vs TdaP) and fax or call us with this info  Discussed monthly self breast exams and yearly mammograms after the age of 36; at least 30 minutes of aerobic activity at least 5 days/week; proper sunscreen use reviewed; healthy diet, portion control, weight loss encouraged.  Regular seatbelt use; changing batteries in smoke detectors.  Immunization recommendations discussed--find out Td vs TdaP and date.  Colonoscopy recommendations reviewed, age 45, sooner prn.  Advised to get headache meds  refilled (for NPH) from neuro  Counseled re: risks and side effects of OCP's, need for condom use for prevention of STD's, backup birth control when on ABX or missed pills, etc.

## 2013-02-11 ENCOUNTER — Telehealth: Payer: Self-pay | Admitting: *Deleted

## 2013-02-11 NOTE — Telephone Encounter (Signed)
Message copied by Monico Blitz on Fri Feb 11, 2013  3:01 PM ------      Message from: [facility], [place] [facility]      Created: Fri Feb 11, 2013 12:16 PM       Pt needs to be soon ASAP needs refill on meds. Was last seen by [facility].  Please call and advise. [phone] best number to reach her at. ------

## 2013-02-14 ENCOUNTER — Encounter: Payer: Self-pay | Admitting: Family Medicine

## 2013-02-16 ENCOUNTER — Other Ambulatory Visit: Payer: 59

## 2013-02-16 DIAGNOSIS — Z0000 Encounter for general adult medical examination without abnormal findings: Secondary | ICD-10-CM

## 2013-02-16 DIAGNOSIS — R5383 Other fatigue: Secondary | ICD-10-CM

## 2013-02-16 DIAGNOSIS — N926 Irregular menstruation, unspecified: Secondary | ICD-10-CM

## 2013-02-16 DIAGNOSIS — Z13220 Encounter for screening for lipoid disorders: Secondary | ICD-10-CM

## 2013-02-16 DIAGNOSIS — Z8342 Family history of familial hypercholesterolemia: Secondary | ICD-10-CM

## 2013-02-16 LAB — CBC WITH DIFFERENTIAL/PLATELET
Eosinophils Absolute: 0.1 10*3/uL (ref 0.0–0.7)
Hemoglobin: 12.7 g/dL (ref 12.0–15.0)
Lymphocytes Relative: 56 % — ABNORMAL HIGH (ref 12–46)
Lymphs Abs: 3.7 10*3/uL (ref 0.7–4.0)
MCH: 28.3 pg (ref 26.0–34.0)
MCV: 87.8 fL (ref 78.0–100.0)
Monocytes Relative: 7 % (ref 3–12)
Neutrophils Relative %: 35 % — ABNORMAL LOW (ref 43–77)
RBC: 4.49 MIL/uL (ref 3.87–5.11)
WBC: 6.7 10*3/uL (ref 4.0–10.5)

## 2013-02-16 LAB — LIPID PANEL
Cholesterol: 286 mg/dL — ABNORMAL HIGH (ref 0–200)
HDL: 42 mg/dL (ref >39)
Total CHOL/HDL Ratio: 6.8 Ratio
VLDL: 38 mg/dL (ref 0–40)

## 2013-02-16 LAB — COMPREHENSIVE METABOLIC PANEL
Alkaline Phosphatase: 76 U/L (ref 39–117)
BUN: 8 mg/dL (ref 6–23)
Glucose, Bld: 90 mg/dL (ref 70–99)
Total Bilirubin: 0.3 mg/dL (ref 0.3–1.2)

## 2013-02-16 LAB — TSH: TSH: 1.611 u[IU]/mL (ref 0.350–4.500)

## 2013-02-17 ENCOUNTER — Encounter: Payer: Self-pay | Admitting: Family Medicine

## 2013-02-17 DIAGNOSIS — E559 Vitamin D deficiency, unspecified: Secondary | ICD-10-CM | POA: Insufficient documentation

## 2013-02-17 DIAGNOSIS — E7800 Pure hypercholesterolemia, unspecified: Secondary | ICD-10-CM | POA: Insufficient documentation

## 2013-02-24 ENCOUNTER — Encounter: Payer: Self-pay | Admitting: Internal Medicine

## 2013-03-09 ENCOUNTER — Ambulatory Visit: Payer: Self-pay | Admitting: Family Medicine

## 2013-04-07 ENCOUNTER — Ambulatory Visit (INDEPENDENT_AMBULATORY_CARE_PROVIDER_SITE_OTHER): Payer: Self-pay | Admitting: Family Medicine

## 2013-04-07 ENCOUNTER — Telehealth: Payer: Self-pay | Admitting: Internal Medicine

## 2013-04-07 ENCOUNTER — Encounter: Payer: Self-pay | Admitting: Family Medicine

## 2013-04-07 VITALS — BP 120/76 | HR 92 | Ht 64.0 in | Wt 221.0 lb

## 2013-04-07 DIAGNOSIS — R5383 Other fatigue: Secondary | ICD-10-CM

## 2013-04-07 DIAGNOSIS — E7800 Pure hypercholesterolemia, unspecified: Secondary | ICD-10-CM

## 2013-04-07 DIAGNOSIS — R5381 Other malaise: Secondary | ICD-10-CM

## 2013-04-07 DIAGNOSIS — E559 Vitamin D deficiency, unspecified: Secondary | ICD-10-CM

## 2013-04-07 MED ORDER — ERGOCALCIFEROL 1.25 MG (50000 UT) PO CAPS: 50000.0000 [IU] | ORAL_CAPSULE | ORAL | Status: DC

## 2013-04-07 NOTE — Telephone Encounter (Signed)
Pt was seen today with no insurance. She said she spoke with you and could write 2 payments once today and one next week and she said that you said it was okay. Well i told her and melissa told her also that she would not get the discounted price since she did not pay it all up front so when i told her her bill would be 145 and she only had 50 dollars she could pay, i told her balance was 95 left she seemed mad and walked off after i gave her avs papers. There was nothing in her chart that stated you talked to her and told her that so i dont know what was said but pt did not seem happy when she left. Just wanted to let you know  

## 2013-04-07 NOTE — Progress Notes (Signed)
Chief Complaint  Patient presents with  . Follow-up    lab follow up.   Patient presents for f/u on labs done the end of April. This showed significant vitamin D deficiency and hyperlipidemia.  She is complaining of significant fatigue.  Rest of labs were normal--no anemia or thyroid abnormality to account for fatigue.  She denies allergies, and has had normal sleep study in the past.  Hyperlipidemia:  Diet reviewed: Eggs 0-2/week; red meat 3-4x/week.  No cheese; uses skim milk; ice cream, otherwise no creamy sauces/dressings.  Not getting any exercise.  She is currently without insurance, due to get again in 2 months.  She changed jobs, and is happy with her new job in [place].  Past Medical History  Diagnosis Date  . Obesity, unspecified   . Menses, irregular   . Contraceptive management   . Eczema     sees dr Terri Piedra  . Head ache   . 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 Kidney stone 2013    [facility]   Past Surgical History  Procedure Laterality Date  . Breast reduction surgery  07/2010   History   Social History  . Marital Status: Single    Spouse Name: N/A    Number of Children: N/A  . Years of Education: N/A   Occupational History  . 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 Smoking status: Never Smoker   . Smokeless tobacco: Never Used  . Alcohol Use: Yes     Comment: occasional  . Drug Use: No  . Sexually Active: Not Currently   Other Topics Concern  . Not on file   Social History Narrative   Single, lives alone, no pets   Current Outpatient Prescriptions on File Prior to Visit  Medication Sig Dispense Refill  . Norgestimate-Ethinyl Estradiol Triphasic 0.18/0.215/0.25 MG-35 MCG tablet Take 1 tablet by mouth daily.  3 Package  3  . acetaZOLAMIDE (DIAMOX) 500 MG capsule Take 500 mg by mouth 2 (two) times daily.       No current facility-administered medications on file prior to  visit.   No Known Allergies  ROS:  Denies allergies, chest pain, shortness of breath.  Denies sleep apnea. Denies GI complaints, bleeding/bruising, depression or other concerns.  PHYSICAL EXAM: BP 120/76  Pulse 92  Ht 5\' 4"  (1.626 m)  Wt 221 lb (100.245 kg)  BMI 37.92 kg/m2  LMP 03/18/2013 Pleasant, obese female in no distress Normal mood, affect, hygiene and grooming Remainder of visit was limited to counseling/discussion  Lab Results  Component Value Date   CHOL 286* 02/16/2013   HDL 42 02/16/2013   LDLCALC 206* 02/16/2013   TRIG 190* 02/16/2013   CHOLHDL 6.8 02/16/2013   Vitamin D-OH <10.   Chemistry      Component Value Date/Time   NA 138 02/16/2013 0929   K 4.5 02/16/2013 0929   CL 107 02/16/2013 0929   CO2 22 02/16/2013 0929   BUN 8 02/16/2013 0929   CREATININE 0.74 02/16/2013 0929   CREATININE 1.15* 02/16/2012 0505      Component Value Date/Time   CALCIUM 8.7 02/16/2013 0929   ALKPHOS 76 02/16/2013 0929   AST 14 02/16/2013 0929   ALT 11 02/16/2013 0929   BILITOT 0.3 02/16/2013 0929     Lab Results  Component Value Date   TSH 1.611 02/16/2013   Lab Results  Component Value Date   WBC  6.7 02/16/2013   HGB 12.7 02/16/2013   HCT 39.4 02/16/2013   MCV 87.8 02/16/2013   PLT 361 02/16/2013   ASSESSMENT/PLAN:  Pure hypercholesterolemia - Plan: Lipid panel  Unspecified vitamin D deficiency - Plan: ergocalciferol (VITAMIN D2) 50000 UNITS capsule, Vitamin D 25 hydroxy  Other malaise and fatigue  rx 50,000 IU Vitamin D.  If unaffordable, can take 01-4999 IU of OTC daily vitamin D. Plan to recheck level in 3 months (will have insurance then), to see if further high dose/rx is needed.   Low cholesterol diet reviewed.  Has room to cut back on red meat, but otherwise diet isn't too terrible.  Discussed lowfat, low cholesterol diet, getting daily exercise, consider using fish oil capsules.  Discussed goals--without other risk factors, goal LDL 130-160 if ratio is okay.   Plan to  recheck in 3 months, and discuss statins at that time, if needed.  (currently on OCP's).

## 2013-04-07 NOTE — Telephone Encounter (Signed)
Pt was seen today with no insurance. She said she spoke with you and could write 2 payments once today and one next week and she said that you said it was okay. Well i told her and Efraim Kaufmann told her also that she would not get the discounted price since she did not pay it all up front so when i told her her bill would be 145 and she only had 50 dollars she could pay, i told her balance was 95 left she seemed mad and walked off after i gave her avs papers. There was nothing in her chart that stated you talked to her and told her that so i dont know what was said but pt did not seem happy when she left. Just wanted to let you know

## 2013-04-07 NOTE — Patient Instructions (Signed)
Fat and Cholesterol Control Diet Cholesterol levels in your body are determined significantly by your diet. Cholesterol levels may also be related to heart disease. The following material helps to explain this relationship and discusses what you can do to help keep your heart healthy. Not all cholesterol is bad. Low-density lipoprotein (LDL) cholesterol is the "bad" cholesterol. It may cause fatty deposits to build up inside your arteries. High-density lipoprotein (HDL) cholesterol is "good." It helps to remove the "bad" LDL cholesterol from your blood. Cholesterol is a very important risk factor for heart disease. Other risk factors are high blood pressure, smoking, stress, heredity, and weight. The heart muscle gets its supply of blood through the coronary arteries. If your LDL cholesterol is high and your HDL cholesterol is low, you are at risk for having fatty deposits build up in your coronary arteries. This leaves less room through which blood can flow. Without sufficient blood and oxygen, the heart muscle cannot function properly and you may feel chest pains (angina pectoris). When a coronary artery closes up entirely, a part of the heart muscle may die causing a heart attack (myocardial infarction). CHECKING CHOLESTEROL When your caregiver sends your blood to a lab to be examined for cholesterol, a complete lipid (fat) profile may be done. With this test, the total amount of cholesterol and levels of LDL and HDL are determined. Triglycerides are a type of fat that circulates in the blood. They can also be used to determine heart disease risk. The list below describes what the numbers should be: Test: Total Cholesterol.  Less than 200 mg/dl. Test: LDL "bad cholesterol."  Less than 100 mg/dl.  Less than 70 mg/dl if you are at very high risk of a heart attack or sudden cardiac death. Test: HDL "good cholesterol."  Greater than 50 mg/dl for women.  Greater than 40 mg/dl for men. Test:  Triglycerides.  Less than 150 mg/dl. CONTROLLING CHOLESTEROL WITH DIET Although exercise and lifestyle factors are important, your diet is key. That is because certain foods are known to raise cholesterol and others to lower it. The goal is to balance foods for their effect on cholesterol and more importantly, to replace saturated and trans fat with other types of fat, such as monounsaturated fat, polyunsaturated fat, and omega-3 fatty acids. On average, a person should consume no more than 15 to 17 g of saturated fat daily. Saturated and trans fats are considered "bad" fats, and they will raise LDL cholesterol. Saturated fats are primarily found in animal products such as meats, butter, and cream. However, that does not mean you need to give up all your favorite foods. Today, there are good tasting, low-fat, low-cholesterol substitutes for most of the things you like to eat. Choose low-fat or nonfat alternatives. Choose round or loin cuts of red meat. These types of cuts are lowest in fat and cholesterol. Chicken (without the skin), fish, veal, and ground turkey breast are great choices. Eliminate fatty meats, such as hot dogs and salami. Even shellfish have little or no saturated fat. Have a 3 oz (85 g) portion when you eat lean meat, poultry, or fish. Trans fats are also called "partially hydrogenated oils." They are oils that have been scientifically manipulated so that they are solid at room temperature resulting in a longer shelf life and improved taste and texture of foods in which they are added. Trans fats are found in stick margarine, some tub margarines, cookies, crackers, and baked goods.  When baking and cooking, oils   are a great substitute for butter. The monounsaturated oils are especially beneficial since it is believed they lower LDL and raise HDL. The oils you should avoid entirely are saturated tropical oils, such as coconut and palm.  Remember to eat a lot from food groups that are  naturally free of saturated and trans fat, including fish, fruit, vegetables, beans, grains (barley, rice, couscous, bulgur wheat), and pasta (without cream sauces).  IDENTIFYING FOODS THAT LOWER CHOLESTEROL  Soluble fiber may lower your cholesterol. This type of fiber is found in fruits such as apples, vegetables such as broccoli, potatoes, and carrots, legumes such as beans, peas, and lentils, and grains such as barley. Foods fortified with plant sterols (phytosterol) may also lower cholesterol. You should eat at least 2 g per day of these foods for a cholesterol lowering effect.  Read package labels to identify low-saturated fats, trans fat free, and low-fat foods at the supermarket. Select cheeses that have only 2 to 3 g saturated fat per ounce. Use a heart-healthy tub margarine that is free of trans fats or partially hydrogenated oil. When buying baked goods (cookies, crackers), avoid partially hydrogenated oils. Breads and muffins should be made from whole grains (whole-wheat or whole oat flour, instead of "flour" or "enriched flour"). Buy non-creamy canned soups with reduced salt and no added fats.  FOOD PREPARATION TECHNIQUES  Never deep-fry. If you must fry, either stir-fry, which uses very little fat, or use non-stick cooking sprays. When possible, broil, bake, or roast meats, and steam vegetables. Instead of putting butter or margarine on vegetables, use lemon and herbs, applesauce, and cinnamon (for squash and sweet potatoes), nonfat yogurt, salsa, and low-fat dressings for salads.  LOW-SATURATED FAT / LOW-FAT FOOD SUBSTITUTES Meats / Saturated Fat (g)  Avoid: Steak, marbled (3 oz/85 g) / 11 g  Choose: Steak, lean (3 oz/85 g) / 4 g  Avoid: Hamburger (3 oz/85 g) / 7 g  Choose: Hamburger, lean (3 oz/85 g) / 5 g  Avoid: Ham (3 oz/85 g) / 6 g  Choose: Ham, lean cut (3 oz/85 g) / 2.4 g  Avoid: Chicken, with skin, dark meat (3 oz/85 g) / 4 g  Choose: Chicken, skin removed, dark meat (3  oz/85 g) / 2 g  Avoid: Chicken, with skin, light meat (3 oz/85 g) / 2.5 g  Choose: Chicken, skin removed, light meat (3 oz/85 g) / 1 g Dairy / Saturated Fat (g)  Avoid: Whole milk (1 cup) / 5 g  Choose: Low-fat milk, 2% (1 cup) / 3 g  Choose: Low-fat milk, 1% (1 cup) / 1.5 g  Choose: Skim milk (1 cup) / 0.3 g  Avoid: Hard cheese (1 oz/28 g) / 6 g  Choose: Skim milk cheese (1 oz/28 g) / 2 to 3 g  Avoid: Cottage cheese, 4% fat (1 cup) / 6.5 g  Choose: Low-fat cottage cheese, 1% fat (1 cup) / 1.5 g  Avoid: Ice cream (1 cup) / 9 g  Choose: Sherbet (1 cup) / 2.5 g  Choose: Nonfat frozen yogurt (1 cup) / 0.3 g  Choose: Frozen fruit bar / trace  Avoid: Whipped cream (1 tbs) / 3.5 g  Choose: Nondairy whipped topping (1 tbs) / 1 g Condiments / Saturated Fat (g)  Avoid: Mayonnaise (1 tbs) / 2 g  Choose: Low-fat mayonnaise (1 tbs) / 1 g  Avoid: Butter (1 tbs) / 7 g  Choose: Extra light margarine (1 tbs) / 1 g  Avoid: Coconut oil (1   tbs) / 11.8 g  Choose: Olive oil (1 tbs) / 1.8 g  Choose: Corn oil (1 tbs) / 1.7 g  Choose: Safflower oil (1 tbs) / 1.2 g  Choose: Sunflower oil (1 tbs) / 1.4 g  Choose: Soybean oil (1 tbs) / 2.4 g  Choose: Canola oil (1 tbs) / 1 g Document Released: 10/06/2005 Document Revised: 12/29/2011 Document Reviewed: 03/27/2011 Exit[employer] Patient Information 2014 [place], [place].   It is recommended that you get at least 30 minutes of aerobic exercise at least 5 days/week (for weight loss, you may need as much as 60-90 minutes). This can be any activity that gets your heart rate up. This can be divided in 10-15 minute intervals if needed, but try and build up your endurance at least once a week.  Weight bearing exercise is also recommended twice weekly.  If you cannot afford the prescription vitamin D, take 01-4999 IU of over-the-counter vitamin D daily for 3 months.  Return in 3 months for fasting labs, and will need office visit after only if  labs remain abnormal

## 2013-04-08 NOTE — Telephone Encounter (Signed)
If I told pt she could make 2 payments that is fine.  She does not get the discount.

## 2013-07-08 ENCOUNTER — Other Ambulatory Visit: Payer: Self-pay

## 2013-08-05 ENCOUNTER — Ambulatory Visit: Payer: 59

## 2013-08-25 ENCOUNTER — Emergency Department (HOSPITAL_COMMUNITY)
Admission: EM | Admit: 2013-08-25 | Discharge: 2013-08-26 | Disposition: A | Discharge status: Home / Self Care | Payer: 59 | Attending: Emergency Medicine | Admitting: Emergency Medicine

## 2013-08-25 DIAGNOSIS — Z8742 Personal history of other diseases of the female genital tract: Secondary | ICD-10-CM | POA: Insufficient documentation

## 2013-08-25 DIAGNOSIS — S0990XA Unspecified injury of head, initial encounter: Secondary | ICD-10-CM | POA: Insufficient documentation

## 2013-08-25 DIAGNOSIS — Y9389 Activity, other specified: Secondary | ICD-10-CM | POA: Insufficient documentation

## 2013-08-25 DIAGNOSIS — Y92410 Unspecified street and highway as the place of occurrence of the external cause: Secondary | ICD-10-CM | POA: Insufficient documentation

## 2013-08-25 DIAGNOSIS — E669 Obesity, unspecified: Secondary | ICD-10-CM | POA: Insufficient documentation

## 2013-08-25 DIAGNOSIS — G44209 Tension-type headache, unspecified, not intractable: Secondary | ICD-10-CM

## 2013-08-25 DIAGNOSIS — Z8669 Personal history of other diseases of the nervous system and sense organs: Secondary | ICD-10-CM | POA: Insufficient documentation

## 2013-08-25 DIAGNOSIS — Z872 Personal history of diseases of the skin and subcutaneous tissue: Secondary | ICD-10-CM | POA: Insufficient documentation

## 2013-08-25 DIAGNOSIS — Z87442 Personal history of urinary calculi: Secondary | ICD-10-CM | POA: Insufficient documentation

## 2013-08-26 ENCOUNTER — Encounter (HOSPITAL_COMMUNITY): Payer: Self-pay | Admitting: Emergency Medicine

## 2013-08-26 NOTE — ED Provider Notes (Signed)
Medical screening examination/treatment/procedure(s) were performed by non-physician practitioner and as supervising physician I was immediately available for consultation/collaboration.  EKG Interpretation   None        Saintclair Schroader K Tivon Lemoine-Rasch, MD 08/26/13 [phone]
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
## 2013-12-21 ENCOUNTER — Encounter: Payer: Self-pay | Admitting: *Deleted

## 2014-01-25 ENCOUNTER — Encounter: Payer: Self-pay | Admitting: Family Medicine

## 2014-01-25 ENCOUNTER — Ambulatory Visit (INDEPENDENT_AMBULATORY_CARE_PROVIDER_SITE_OTHER): Payer: BC Managed Care – PPO | Admitting: Family Medicine

## 2014-01-25 VITALS — BP 112/72 | HR 72 | Ht 64.0 in | Wt 228.0 lb

## 2014-01-25 DIAGNOSIS — Z5321 Procedure and treatment not carried out due to patient leaving prior to being seen by health care provider: Secondary | ICD-10-CM

## 2014-01-25 DIAGNOSIS — Z5320 Procedure and treatment not carried out because of patient's decision for unspecified reasons: Secondary | ICD-10-CM

## 2014-01-25 NOTE — Progress Notes (Signed)
Patient left without being seen.

## 2014-01-26 ENCOUNTER — Ambulatory Visit: Payer: BC Managed Care – PPO | Admitting: Family Medicine

## 2014-10-01 ENCOUNTER — Encounter (HOSPITAL_COMMUNITY): Payer: Self-pay

## 2014-10-01 ENCOUNTER — Emergency Department (HOSPITAL_COMMUNITY)
Admission: EM | Admit: 2014-10-01 | Discharge: 2014-10-01 | Disposition: A | Discharge status: Home / Self Care | Payer: Self-pay | Source: Home / Self Care | Attending: Emergency Medicine | Admitting: Emergency Medicine

## 2014-10-01 DIAGNOSIS — J40 Bronchitis, not specified as acute or chronic: Secondary | ICD-10-CM

## 2014-10-01 LAB — POCT RAPID STREP A: STREPTOCOCCUS, GROUP A SCREEN (DIRECT): NEGATIVE

## 2014-10-01 MED ORDER — ALBUTEROL SULFATE HFA 108 (90 BASE) MCG/ACT IN AERS: 2.0000 | INHALATION_SPRAY | RESPIRATORY_TRACT | Status: DC | PRN

## 2014-10-01 MED ORDER — IPRATROPIUM-ALBUTEROL 0.5-2.5 (3) MG/3ML IN SOLN
3.0000 mL | Freq: Once | RESPIRATORY_TRACT | Status: AC
  Administered 2014-10-01: 3 mL via RESPIRATORY_TRACT

## 2014-10-01 MED ORDER — LORATADINE 10 MG PO TABS: 10.0000 mg | ORAL_TABLET | Freq: Every day | ORAL | Status: DC

## 2014-10-01 MED ORDER — IPRATROPIUM-ALBUTEROL 0.5-2.5 (3) MG/3ML IN SOLN
RESPIRATORY_TRACT | Status: AC
  Filled 2014-10-01: qty 3

## 2014-10-01 MED ORDER — PREDNISONE 20 MG PO TABS: 40.0000 mg | ORAL_TABLET | Freq: Every day | ORAL | Status: DC

## 2014-10-01 MED ORDER — AMOXICILLIN 500 MG PO CAPS: 500.0000 mg | ORAL_CAPSULE | Freq: Two times a day (BID) | ORAL | Status: DC

## 2014-10-01 NOTE — Discharge Instructions (Signed)
You have bronchitis. Take Amoxicillin 1 pill twice a day for 10 days. Take prednisone 2 pills daily for 5 days. Take the loratadine daily for the next week, then as needed. Use the albuterol as needed for cough and chest tightness. Put some vaseline in your nose to help with the rawness.  Follow up as needed.

## 2014-10-01 NOTE — ED Provider Notes (Signed)
CSN: [id number]     Arrival date & time 10/01/14  1607 History   None    Chief Complaint  Patient presents with  . URI   (Consider location/radiation/quality/duration/timing/severity/associated sxs/prior Treatment) HPI  She is a 34 year old woman here for evaluation of URI symptoms. She states that starting yesterday she has had cough, sneezing, nasal congestion, rhinorrhea, chest tightness, sore throat, loss of voice. She denies any shortness of breath or chest pain. No nausea or vomiting. She does report some back pain with sneezing and coughing. She has felt warm, but has not taken her temperature. She has been using NyQuil with minimal improvement.  Past Medical History  Diagnosis Date  . Obesity, unspecified   . Menses, irregular   . Contraceptive management   . Eczema     sees dr Allyson Sabal  . Head ache   . 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 Kidney stone 2013    [facility]  . Eczema    Past Surgical History  Procedure Laterality Date  . Breast reduction surgery  07/2010   Family History  Problem Relation Age of Onset  . Diabetes Mother   . Hypertension Mother   . Gout Mother   . Crohn's disease Mother   . Osteoporosis Mother   . Kidney disease Mother     mild, related to DM  . Hyperlipidemia Mother   . Stroke Father 16  . Hypertension Father   . Seizures Father     related to stroke  . Hyperlipidemia Father   . Kidney Stones Brother   . Hyperlipidemia Maternal Grandmother   . Cancer Neg Hx   . Heart disease Neg Hx    History  Substance Use Topics  . Smoking status: Never Smoker   . Smokeless tobacco: Never Used  . Alcohol Use: Yes     Comment: occasional   OB History    Gravida Para Term Preterm AB TAB SAB Ectopic Multiple Living   0 0 0 0 0 0 0 0 0 0      Review of Systems  Constitutional: Negative for fever and chills.  HENT: Positive for congestion, rhinorrhea, sneezing, sore throat and voice change. Negative for ear pain  and trouble swallowing.   Eyes: Positive for discharge (watery).  Respiratory: Positive for cough and chest tightness. Negative for shortness of breath.   Cardiovascular: Negative for chest pain.  Gastrointestinal: Negative for nausea, vomiting, abdominal pain and diarrhea.  Musculoskeletal: Positive for myalgias.    Allergies  Review of patient's allergies indicates no known allergies.  Home Medications   Prior to Admission medications   Medication Sig Start Date End Date Taking? Authorizing Provider  albuterol (PROVENTIL HFA;VENTOLIN HFA) 108 (90 BASE) MCG/ACT inhaler Inhale 2 puffs into the lungs every 4 (four) hours as needed for wheezing or shortness of breath. 10/01/14   Melony Overly, MD  amoxicillin (AMOXIL) 500 MG capsule Take 1 capsule (500 mg total) by mouth 2 (two) times daily. 10/01/14   Melony Overly, MD  loratadine (CLARITIN) 10 MG tablet Take 1 tablet (10 mg total) by mouth daily. 10/01/14   Melony Overly, MD  predniSONE (DELTASONE) 20 MG tablet Take 2 tablets (40 mg total) by mouth daily. 10/01/14   Melony Overly, MD   BP 143/90 mmHg  Pulse 113  Temp(Src) 98.1 F (36.7 C) (Oral)  Resp 18  SpO2 99% Physical Exam  Constitutional: She appears well-developed and well-nourished. She appears distressed (looks ill).  HENT:  Head: Normocephalic and atraumatic.  Right Ear: External ear normal.  Left Ear: External ear normal.  Nose: Mucosal edema and rhinorrhea present.  Mouth/Throat: Mucous membranes are not dry. Posterior oropharyngeal erythema present. No oropharyngeal exudate.  Eyes: Conjunctivae are normal.  Neck: Neck supple.  Cardiovascular: Regular rhythm and normal heart sounds.  Tachycardia present.   No murmur heard. Pulmonary/Chest: Effort normal and breath sounds normal. No respiratory distress. She has no wheezes. She has no rales.  Lymphadenopathy:    She has no cervical adenopathy.    ED Course  Procedures (including critical care time) Labs  Review Labs Reviewed  POCT RAPID STREP A ([facility] CARE ONLY)    Imaging Review No results found.   MDM   1. Bronchitis    We'll treat with DuoNeb for chest tightness. She reports subjective improvement after the breathing treatment.  We'll treat with amoxicillin for 10 days. Prednisone for 5 days. Loratadine daily. Albuterol as needed.  Follow-up if symptoms worsen or develops fevers.    Melony Overly, MD 10/01/14 [phone]

## 2014-10-01 NOTE — ED Notes (Signed)
C/o 2 day duration of URI type problems, hurts to talk/cough/sneeze. Minimal relief w Nyquil . Hoarse, NAD

## 2014-10-03 LAB — CULTURE, GROUP A STREP

## 2014-11-17 ENCOUNTER — Other Ambulatory Visit (HOSPITAL_COMMUNITY)
Admission: RE | Admit: 2014-11-17 | Discharge: 2014-11-17 | Disposition: A | Discharge status: Home / Self Care | Payer: Self-pay | Source: Ambulatory Visit | Attending: Family Medicine | Admitting: Family Medicine

## 2014-11-17 ENCOUNTER — Encounter (HOSPITAL_COMMUNITY): Payer: Self-pay | Admitting: Emergency Medicine

## 2014-11-17 ENCOUNTER — Emergency Department (INDEPENDENT_AMBULATORY_CARE_PROVIDER_SITE_OTHER)
Admission: EM | Admit: 2014-11-17 | Discharge: 2014-11-17 | Disposition: A | Discharge status: Home / Self Care | Payer: Self-pay | Source: Home / Self Care | Attending: Family Medicine | Admitting: Family Medicine

## 2014-11-17 DIAGNOSIS — N760 Acute vaginitis: Secondary | ICD-10-CM | POA: Insufficient documentation

## 2014-11-17 DIAGNOSIS — R21 Rash and other nonspecific skin eruption: Secondary | ICD-10-CM

## 2014-11-17 DIAGNOSIS — N898 Other specified noninflammatory disorders of vagina: Secondary | ICD-10-CM

## 2014-11-17 DIAGNOSIS — Z113 Encounter for screening for infections with a predominantly sexual mode of transmission: Secondary | ICD-10-CM | POA: Insufficient documentation

## 2014-11-17 LAB — POCT URINALYSIS DIP (DEVICE)
Bilirubin Urine: NEGATIVE
GLUCOSE, UA: NEGATIVE mg/dL
Hgb urine dipstick: NEGATIVE
KETONES UR: NEGATIVE mg/dL
NITRITE: NEGATIVE
PH: 7 (ref 5.0–8.0)
Protein, ur: NEGATIVE mg/dL
Specific Gravity, Urine: 1.025 (ref 1.005–1.030)
Urobilinogen, UA: 1 mg/dL (ref 0.0–1.0)

## 2014-11-17 LAB — POCT PREGNANCY, URINE: PREG TEST UR: NEGATIVE

## 2014-11-17 MED ORDER — METRONIDAZOLE 500 MG PO TABS: 500.0000 mg | ORAL_TABLET | Freq: Two times a day (BID) | ORAL | Status: DC

## 2014-11-17 MED ORDER — TRIAMCINOLONE ACETONIDE 0.5 % EX OINT: 1.0000 "application " | TOPICAL_OINTMENT | Freq: Two times a day (BID) | CUTANEOUS | Status: DC

## 2014-11-17 NOTE — Discharge Instructions (Signed)
Thank you for coming in today. If your belly pain worsens, or you have high fever, bad vomiting, blood in your stool or black tarry stool go to the Emergency Room.   Vaginitis Vaginitis is an inflammation of the vagina. It is most often caused by a change in the normal balance of the bacteria and yeast that live in the vagina. This change in balance causes an overgrowth of certain bacteria or yeast, which causes the inflammation. There are different types of vaginitis, but the most common types are:  Bacterial vaginosis.  Yeast infection (candidiasis).  Trichomoniasis vaginitis. This is a sexually transmitted infection (STI).  Viral vaginitis.  Atropic vaginitis.  Allergic vaginitis. CAUSES  The cause depends on the type of vaginitis. Vaginitis can be caused by:  Bacteria (bacterial vaginosis).  Yeast (yeast infection).  A parasite (trichomoniasis vaginitis)  A virus (viral vaginitis).  Low hormone levels (atrophic vaginitis). Low hormone levels can occur during pregnancy, breastfeeding, or after menopause.  Irritants, such as bubble baths, scented tampons, and feminine sprays (allergic vaginitis). Other factors can change the normal balance of the yeast and bacteria that live in the vagina. These include:  Antibiotic medicines.  Poor hygiene.  Diaphragms, vaginal sponges, spermicides, birth control pills, and intrauterine devices (IUD).  Sexual intercourse.  Infection.  Uncontrolled diabetes.  A weakened immune system. SYMPTOMS  Symptoms can vary depending on the cause of the vaginitis. Common symptoms include:  Abnormal vaginal discharge.  The discharge is white, gray, or yellow with bacterial vaginosis.  The discharge is thick, white, and cheesy with a yeast infection.  The discharge is frothy and yellow or greenish with trichomoniasis.  A bad vaginal odor.  The odor is fishy with bacterial vaginosis.  Vaginal itching, pain, or swelling.  Painful  intercourse.  Pain or burning when urinating. Sometimes, there are no symptoms. TREATMENT  Treatment will vary depending on the type of infection.   Bacterial vaginosis and trichomoniasis are often treated with antibiotic creams or pills.  Yeast infections are often treated with antifungal medicines, such as vaginal creams or suppositories.  Viral vaginitis has no cure, but symptoms can be treated with medicines that relieve discomfort. Your sexual partner should be treated as well.  Atrophic vaginitis may be treated with an estrogen cream, pill, suppository, or vaginal ring. If vaginal dryness occurs, lubricants and moisturizing creams may help. You may be told to avoid scented soaps, sprays, or douches.  Allergic vaginitis treatment involves quitting the use of the product that is causing the problem. Vaginal creams can be used to treat the symptoms. HOME CARE INSTRUCTIONS   Take all medicines as directed by your caregiver.  Keep your genital area clean and dry. Avoid soap and only rinse the area with water.  Avoid douching. It can remove the healthy bacteria in the vagina.  Do not use tampons or have sexual intercourse until your vaginitis has been treated. Use sanitary pads while you have vaginitis.  Wipe from front to back. This avoids the spread of bacteria from the rectum to the vagina.  Let air reach your genital area.  Wear cotton underwear to decrease moisture buildup.  Avoid wearing underwear while you sleep until your vaginitis is gone.  Avoid tight pants and underwear or nylons without a cotton panel.  Take off wet clothing (especially bathing suits) as soon as possible.  Use mild, non-scented products. Avoid using irritants, such as:  Scented feminine sprays.  Fabric softeners.  Scented detergents.  Scented tampons.  Scented  soaps or bubble baths.  Practice safe sex and use condoms. Condoms may prevent the spread of trichomoniasis and viral  vaginitis. SEEK MEDICAL CARE IF:   You have abdominal pain.  You have a fever or persistent symptoms for more than 2-3 days.  You have a fever and your symptoms suddenly get worse. Document Released: 08/03/2007 Document Revised: 06/30/2012 Document Reviewed: 03/18/2012 [facility] Patient Information 2015 [place], [place]. This information is not intended to replace advice given to you by your health care provider. Make sure you discuss any questions you have with your health care provider.   Eczema Eczema, also called atopic dermatitis, is a skin disorder that causes inflammation of the skin. It causes a red rash and dry, scaly skin. The skin becomes very itchy. Eczema is generally worse during the cooler winter months and often improves with the warmth of summer. Eczema usually starts showing signs in infancy. Some children outgrow eczema, but it may last through adulthood.  CAUSES  The exact cause of eczema is not known, but it appears to run in families. People with eczema often have a family history of eczema, allergies, asthma, or hay fever. Eczema is not contagious. Flare-ups of the condition may be caused by:   Contact with something you are sensitive or allergic to.   Stress. SIGNS AND SYMPTOMS  Dry, scaly skin.   Red, itchy rash.   Itchiness. This may occur before the skin rash and may be very intense.  DIAGNOSIS  The diagnosis of eczema is usually made based on symptoms and medical history. TREATMENT  Eczema cannot be cured, but symptoms usually can be controlled with treatment and other strategies. A treatment plan might include:  Controlling the itching and scratching.   Use over-the-counter antihistamines as directed for itching. This is especially useful at night when the itching tends to be worse.   Use over-the-counter steroid creams as directed for itching.   Avoid scratching. Scratching makes the rash and itching worse. It may also result in a skin  infection (impetigo) due to a break in the skin caused by scratching.   Keeping the skin well moisturized with creams every day. This will seal in moisture and help prevent dryness. Lotions that contain alcohol and water should be avoided because they can dry the skin.   Limiting exposure to things that you are sensitive or allergic to (allergens).   Recognizing situations that cause stress.   Developing a plan to manage stress.  HOME CARE INSTRUCTIONS   Only take over-the-counter or prescription medicines as directed by your health care provider.   Do not use anything on the skin without checking with your health care provider.   Keep baths or showers short (5 minutes) in warm (not hot) water. Use mild cleansers for bathing. These should be unscented. You may add nonperfumed bath oil to the bath water. It is best to avoid soap and bubble bath.   Immediately after a bath or shower, when the skin is still damp, apply a moisturizing ointment to the entire body. This ointment should be a petroleum ointment. This will seal in moisture and help prevent dryness. The thicker the ointment, the better. These should be unscented.   Keep fingernails cut short. Children with eczema may need to wear soft gloves or mittens at night after applying an ointment.   Dress in clothes made of cotton or cotton blends. Dress lightly, because heat increases itching.   A child with eczema should stay away from anyone with  fever blisters or cold sores. The virus that causes fever blisters (herpes simplex) can cause a serious skin infection in children with eczema. SEEK MEDICAL CARE IF:   Your itching interferes with sleep.   Your rash gets worse or is not better within 1 week after starting treatment.   You see pus or soft yellow scabs in the rash area.   You have a fever.   You have a rash flare-up after contact with someone who has fever blisters.  Document Released: 10/03/2000 Document  Revised: 07/27/2013 Document Reviewed: 05/09/2013 [facility] Patient Information 2015 [place], [place]. This information is not intended to replace advice given to you by your health care provider. Make sure you discuss any questions you have with your health care provider.

## 2014-11-17 NOTE — ED Provider Notes (Signed)
Stacey Byrd is a 34 y.o. female who presents to Urgent Care today for vaginal discharge and rash.  1) patient has pain in the inferior portion of her labia. She thinks she irritated herself with rough toilet paper. Additionally she notes vaginal discharge present for about a week and a half. No history of herpes. No fevers or chills abdominal pain.  2) additionally patient has a rash on her upper extremities. This is somewhat consistent with previous episodes of eczema. She has tried hydrocortisone cream she has not helped much. No new soaps detergents shampoos cosmetics etc. No new medications.   Past Medical History  Diagnosis Date  . Obesity, unspecified   . Menses, irregular   . Contraceptive management   . Eczema     sees dr Allyson Sabal  . Head ache   . 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 Kidney stone 2013    [facility]  . Eczema    Past Surgical History  Procedure Laterality Date  . Breast reduction surgery  07/2010   History  Substance Use Topics  . Smoking status: Never Smoker   . Smokeless tobacco: Never Used  . Alcohol Use: Yes     Comment: occasional   ROS as above Medications: No current facility-administered medications for this encounter.   Current Outpatient Prescriptions  Medication Sig Dispense Refill  . albuterol (PROVENTIL HFA;VENTOLIN HFA) 108 (90 BASE) MCG/ACT inhaler Inhale 2 puffs into the lungs every 4 (four) hours as needed for wheezing or shortness of breath. 1 Inhaler 0  . loratadine (CLARITIN) 10 MG tablet Take 1 tablet (10 mg total) by mouth daily. 30 tablet 0  . metroNIDAZOLE (FLAGYL) 500 MG tablet Take 1 tablet (500 mg total) by mouth 2 (two) times daily. 14 tablet 0  . triamcinolone ointment (KENALOG) 0.5 % Apply 1 application topically 2 (two) times daily. 60 g 1   No Known Allergies   Exam:  BP 144/84 mmHg  Pulse 98  Temp(Src) 99.9 F (37.7 C) (Oral)  Resp 16  SpO2 98%  LMP 10/20/2014 Gen: Well NAD HEENT:  EOMI,  MMM Lungs: Normal work of breathing. CTABL Heart: RRR no MRG Abd: NABS, Soft. Nondistended, Nontender Exts: Brisk capillary refill, warm and well perfused.  Skin: Erythematous maculopapular rash bilateral upper extremity somewhat consistent with eczema GYN: Painful ulcer present on the lower left portion of her labia majora consistent with genital herpes. No vesicles visible. Vaginal canal with thin white discharge. Normal-appearing cervix. Nontender.  Results for orders placed or performed during the hospital encounter of 11/17/14 (from the past 24 hour(s))  POCT urinalysis dip (device)     Status: Abnormal   Collection Time: 11/17/14  7:22 PM  Result Value Ref Range   Glucose, UA NEGATIVE NEGATIVE mg/dL   Bilirubin Urine NEGATIVE NEGATIVE   Ketones, ur NEGATIVE NEGATIVE mg/dL   Specific Gravity, Urine 1.025 1.005 - 1.030   Hgb urine dipstick NEGATIVE NEGATIVE   pH 7.0 5.0 - 8.0   Protein, ur NEGATIVE NEGATIVE mg/dL   Urobilinogen, UA 1.0 0.0 - 1.0 mg/dL   Nitrite NEGATIVE NEGATIVE   Leukocytes, UA TRACE (A) NEGATIVE  Pregnancy, urine POC     Status: None   Collection Time: 11/17/14  7:28 PM  Result Value Ref Range   Preg Test, Ur NEGATIVE NEGATIVE   No results found.  Assessment and Plan: 34 y.o. female with  1) vaginal discharge likely BV cytology for gonorrhea Trichomonas and Chlamydia pending. Patient  declined HIV and RPR. 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 Symptoms present for 2 weeks. This is too late for acyclovir. HSV culture pending. 3) rash: Likely a dermatitis. Treat with triamcinolone ointment  Discussed warning signs or symptoms. Please see discharge instructions. Patient expresses understanding.     Gregor Hams, MD 11/17/14 2027

## 2014-11-17 NOTE — ED Notes (Signed)
C/o vag d/c onset 1.5 weeks Also reports eczema flare up onset 1 week Alert, no signs of acute distress.

## 2014-11-20 LAB — CERVICOVAGINAL ANCILLARY ONLY
Chlamydia: NEGATIVE
NEISSERIA GONORRHEA: NEGATIVE
WET PREP (BD AFFIRM): NEGATIVE
WET PREP (BD AFFIRM): NEGATIVE
WET PREP (BD AFFIRM): NEGATIVE

## 2014-11-20 LAB — HERPES SIMPLEX VIRUS CULTURE
CULTURE: NOT DETECTED
Special Requests: NORMAL

## 2014-11-20 NOTE — ED Notes (Signed)
Call from patient, after confirming ID, discussed negative reports

## 2014-11-23 ENCOUNTER — Telehealth (HOSPITAL_COMMUNITY): Payer: Self-pay | Admitting: *Deleted

## 2014-11-23 NOTE — ED Notes (Addendum)
Dr. Georgina Snell asked for pt. to be notified to f/u with PCP or OB-GYN.  I called pt. and left a message to call. Call 1. Roselyn Meier 11/23/2014 Left message.  Call 2. 11/26/2014 I called pt. Pt. Verified and given Dr. Clovis Riley instructions.  She said she has a PCP with whom she can f/u .  Call 3. 11/29/2014

## 2015-10-15 ENCOUNTER — Inpatient Hospital Stay (HOSPITAL_COMMUNITY)
Admission: AD | Admit: 2015-10-15 | Discharge: 2015-10-15 | Disposition: A | Discharge status: Home / Self Care | Payer: Self-pay | Source: Ambulatory Visit | Attending: Obstetrics and Gynecology | Admitting: Obstetrics and Gynecology

## 2015-10-15 ENCOUNTER — Encounter (HOSPITAL_COMMUNITY): Payer: Self-pay | Admitting: *Deleted

## 2015-10-15 DIAGNOSIS — N939 Abnormal uterine and vaginal bleeding, unspecified: Secondary | ICD-10-CM | POA: Insufficient documentation

## 2015-10-15 DIAGNOSIS — Z87442 Personal history of urinary calculi: Secondary | ICD-10-CM | POA: Insufficient documentation

## 2015-10-15 DIAGNOSIS — Z833 Family history of diabetes mellitus: Secondary | ICD-10-CM | POA: Insufficient documentation

## 2015-10-15 DIAGNOSIS — Z8249 Family history of ischemic heart disease and other diseases of the circulatory system: Secondary | ICD-10-CM | POA: Insufficient documentation

## 2015-10-15 DIAGNOSIS — Z87891 Personal history of nicotine dependence: Secondary | ICD-10-CM | POA: Insufficient documentation

## 2015-10-15 HISTORY — DX: Unspecified infectious disease: B99.9

## 2015-10-15 LAB — CBC
HCT: 41.2 % (ref 36.0–46.0)
Hemoglobin: 12.9 g/dL (ref 12.0–15.0)
MCH: 27.9 pg (ref 26.0–34.0)
MCHC: 31.3 g/dL (ref 30.0–36.0)
MCV: 89.2 fL (ref 78.0–100.0)
PLATELETS: 359 10*3/uL (ref 150–400)
RBC: 4.62 MIL/uL (ref 3.87–5.11)
RDW: 13.4 % (ref 11.5–15.5)
WBC: 6 10*3/uL (ref 4.0–10.5)

## 2015-10-15 LAB — URINE MICROSCOPIC-ADD ON

## 2015-10-15 LAB — WET PREP, GENITAL
CLUE CELLS WET PREP: NONE SEEN
Sperm: NONE SEEN
TRICH WET PREP: NONE SEEN
Yeast Wet Prep HPF POC: NONE SEEN

## 2015-10-15 LAB — URINALYSIS, ROUTINE W REFLEX MICROSCOPIC
GLUCOSE, UA: NEGATIVE mg/dL
Ketones, ur: 15 mg/dL — AB
Nitrite: POSITIVE — AB
PH: 5 (ref 5.0–8.0)
Protein, ur: 100 mg/dL — AB

## 2015-10-15 LAB — POCT PREGNANCY, URINE: Preg Test, Ur: NEGATIVE

## 2015-10-15 MED ORDER — MEGESTROL ACETATE 20 MG PO TABS: 20.0000 mg | ORAL_TABLET | Freq: Every day | ORAL | Status: DC

## 2015-10-15 NOTE — MAU Note (Signed)
Had reg cycle beginning of Dec, started to get lighter, but has continued to have some sort of bloody/brownish d/c daily since.

## 2015-10-15 NOTE — Discharge Instructions (Signed)

## 2015-10-15 NOTE — MAU Provider Note (Signed)
MAU HISTORY AND PHYSICAL  Chief Complaint:  Vaginal Bleeding   Stacey Byrd is a 34 y.o.  G0P0000 presenting for Vaginal Bleeding  For this past month has had spotting and brown discharge intermittently. This happened once before, 2009. Was placed on OCPs and issue resolved. Not currently taking OCPs or other birth control. Occasional light-headedness, no palpitations. Cites irregular cycle for many years. Mild vaginal discomfort. Sexually active with me, one sexual partner this past year, denies history STD. No dysuria or hematuria, no fevers or nausea/vomiting. Some pelvic discomfort.  Never been pregnant. Only meds are prn excedrin for headache. No personal or family history cancer. Up to 4-5 pads a day.  Normal pap and TSH in 2014.  Past Medical History  Diagnosis Date  . Obesity, unspecified   . Menses, irregular   . Contraceptive management   . Eczema     sees dr Allyson Sabal  . Head ache   . 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 Kidney stone 2013    [facility]  . Eczema   . Infection     UTI    Past Surgical History  Procedure Laterality Date  . Breast reduction surgery  07/2010  . Breast surgery      Family History  Problem Relation Age of Onset  . Diabetes Mother   . Hypertension Mother   . Gout Mother   . Crohn's disease Mother   . Osteoporosis Mother   . Kidney disease Mother     mild, related to DM  . Hyperlipidemia Mother   . Stroke Father 85  . Hypertension Father   . Seizures Father     related to stroke  . Hyperlipidemia Father   . Kidney Stones Brother   . Hyperlipidemia Maternal Grandmother   . Cancer Neg Hx   . Heart disease Neg Hx     Social History  Substance Use Topics  . Smoking status: Former Research scientist (life sciences)  . Smokeless tobacco: Never Used     Comment: quit in early 20's  . Alcohol Use: Yes     Comment: occasional-monthlyt    No Known Allergies  Prescriptions prior to admission  Medication Sig Dispense Refill Last  Dose  . albuterol (PROVENTIL HFA;VENTOLIN HFA) 108 (90 BASE) MCG/ACT inhaler Inhale 2 puffs into the lungs every 4 (four) hours as needed for wheezing or shortness of breath. (Patient not taking: Reported on 10/15/2015) 1 Inhaler 0 Not Taking at Unknown time  . loratadine (CLARITIN) 10 MG tablet Take 1 tablet (10 mg total) by mouth daily. (Patient not taking: Reported on 10/15/2015) 30 tablet 0 Not Taking at Unknown time  . metroNIDAZOLE (FLAGYL) 500 MG tablet Take 1 tablet (500 mg total) by mouth 2 (two) times daily. (Patient not taking: Reported on 10/15/2015) 14 tablet 0 Completed Course at Unknown time  . triamcinolone ointment (KENALOG) 0.5 % Apply 1 application topically 2 (two) times daily. (Patient not taking: Reported on 10/15/2015) 60 g 1 Not Taking at Unknown time    Review of Systems - Negative except for what is mentioned in HPI.  Physical Exam  Blood pressure 141/79, pulse 85, temperature 97.9 F (36.6 C), temperature source Oral, resp. rate 18, weight 240 lb 12.8 oz (109.226 kg), last menstrual period 09/20/2015. GENERAL: Well-developed, well-nourished female in no acute distress.  LUNGS: Clear to auscultation bilaterally.  HEART: Regular rate and rhythm. ABDOMEN: Soft, nontender, nondistended, gravid.  EXTREMITIES: Nontender, no edema, 2+ distal pulses. GU: mild/mod amount  dark red blood in vagina and blood-tinged mucous at normal cervix, no cmt or adnexal fullness/tenderness   Labs: Results for orders placed or performed during the hospital encounter of 10/15/15 (from the past 24 hour(s))  Urinalysis, Routine w reflex microscopic (not at [facility])   Collection Time: 10/15/15  7:35 AM  Result Value Ref Range   Color, Urine YELLOW YELLOW   APPearance HAZY (A) CLEAR   Specific Gravity, Urine >1.030 (H) 1.005 - 1.030   pH 5.0 5.0 - 8.0   Glucose, UA NEGATIVE NEGATIVE mg/dL   Hgb urine dipstick LARGE (A) NEGATIVE   Bilirubin Urine SMALL (A) NEGATIVE   Ketones, ur 15 (A)  NEGATIVE mg/dL   Protein, ur 100 (A) NEGATIVE mg/dL   Nitrite POSITIVE (A) NEGATIVE   Leukocytes, UA TRACE (A) NEGATIVE  Urine microscopic-add on   Collection Time: 10/15/15  7:35 AM  Result Value Ref Range   Squamous Epithelial / LPF 0-5 (A) NONE SEEN   WBC, UA 0-5 0 - 5 WBC/hpf   RBC / HPF TOO NUMEROUS TO COUNT 0 - 5 RBC/hpf   Bacteria, UA FEW (A) NONE SEEN  Pregnancy, urine POC   Collection Time: 10/15/15  7:45 AM  Result Value Ref Range   Preg Test, Ur NEGATIVE NEGATIVE  Wet prep, genital   Collection Time: 10/15/15  8:10 AM  Result Value Ref Range   Yeast Wet Prep HPF POC NONE SEEN NONE SEEN   Trich, Wet Prep NONE SEEN NONE SEEN   Clue Cells Wet Prep HPF POC NONE SEEN NONE SEEN   WBC, Wet Prep HPF POC MANY (A) NONE SEEN   Sperm NONE SEEN   CBC   Collection Time: 10/15/15  8:35 AM  Result Value Ref Range   WBC 6.0 4.0 - 10.5 K/uL   RBC 4.62 3.87 - 5.11 MIL/uL   Hemoglobin 12.9 12.0 - 15.0 g/dL   HCT 41.2 36.0 - 46.0 %   MCV 89.2 78.0 - 100.0 fL   MCH 27.9 26.0 - 34.0 pg   MCHC 31.3 30.0 - 36.0 g/dL   RDW 13.4 11.5 - 15.5 %   Platelets 359 150 - 400 K/uL    Imaging Studies:  No results found.  Assessment: Stacey Byrd is  34 y.o. G0P0000 at Unknown presents with AUB. No signs/symptoms hemodynamic instability and no significant bleeding seen on exam; h/h wnl. Has hx of this so think ovulatory dysfunction most likely. Upreg negative. Wet prep unremarkable. G/c collected; no cmt or fever of n/v to suggest PID. UTD with pap screening and no suspicious lesions.  [place] with nitrites but patient asymptomatic; will send for culture, but had long discussion w/ patient who confirms absence of cystitis/pyelonephritis symptoms, and as is not pregnant and not immunosuppressed or diabitic and does not have an indwelling catheter, if present this would constitute asymptomatic bacteriuria, and no indication to treat.  Plan: - megace 20 mg po qd or bid - bleeding/infection return  precautions - outpt transvaginal u/s - gyn clinic referral - f/u g/c   Desma Maxim 12/26/20169:20 AM

## 2015-10-17 LAB — CULTURE, OB URINE

## 2015-10-19 LAB — GC/CHLAMYDIA PROBE AMP (~~LOC~~) NOT AT ARMC
CHLAMYDIA, DNA PROBE: NEGATIVE
NEISSERIA GONORRHEA: NEGATIVE

## 2016-04-01 ENCOUNTER — Ambulatory Visit (INDEPENDENT_AMBULATORY_CARE_PROVIDER_SITE_OTHER): Payer: Self-pay

## 2016-04-01 DIAGNOSIS — N390 Urinary tract infection, site not specified: Secondary | ICD-10-CM

## 2016-04-01 LAB — POCT URINALYSIS DIP (DEVICE)
BILIRUBIN URINE: NEGATIVE
GLUCOSE, UA: NEGATIVE mg/dL
Ketones, ur: NEGATIVE mg/dL
LEUKOCYTES UA: NEGATIVE
Nitrite: NEGATIVE
PH: 5.5 (ref 5.0–8.0)
PROTEIN: 30 mg/dL — AB
Specific Gravity, Urine: >=1.03 (ref 1.005–1.030)
UROBILINOGEN UA: 1 mg/dL (ref 0.0–1.0)

## 2016-04-02 LAB — URINE CULTURE: Colony Count: 30000

## 2016-04-03 ENCOUNTER — Encounter: Payer: Self-pay | Admitting: Obstetrics and Gynecology

## 2016-04-03 ENCOUNTER — Ambulatory Visit (INDEPENDENT_AMBULATORY_CARE_PROVIDER_SITE_OTHER): Payer: Self-pay | Admitting: Obstetrics and Gynecology

## 2016-04-03 VITALS — BP 141/76 | HR 110 | Wt 227.0 lb

## 2016-04-03 DIAGNOSIS — N939 Abnormal uterine and vaginal bleeding, unspecified: Secondary | ICD-10-CM

## 2016-04-03 HISTORY — DX: Abnormal uterine and vaginal bleeding, unspecified: N93.9

## 2016-04-03 NOTE — Progress Notes (Signed)
[place] scheduled for June 22nd @ 0745.  Pt notified

## 2016-04-05 NOTE — Progress Notes (Signed)
Obstetrics and Gynecology Visit New Patient Evaluation  Appointment Date: 04/03/2016  Primary Care Provider: No primary care provider on file.  Referring Provider: Self  Chief Complaint:  AUB  History of Present Illness: Stacey Byrd is a 35 y.o. African-American G0 (Patient's last menstrual period was 03/07/2016 (exact date).), seen for the above chief complaint. Her past medical history is significant for HTN, BMI 39, h/o headaches, hyperlipidemia.  Patient last seen in [facility] and never in our GYN clinic before.  She was last seen in [facility] in 09/2015 for VB. Negative CBC, GC/CT, UPT, exam (except mild to moderate dark blood in vault) and she had a normal TSH in 2014 and negative pap and HPV testing in 2014.  She was put on megace and was to have an outpatien u/s. She come in today for continued AUB.  She states that she has always had AUB and at least for several years. She has irregular bleeding and doesn't seem to have a regular period and sometimes has pain with her VB. She states that she takes the megace qday and increases it when she has VB; she doesn't have VB or even every week. She was on OCPs for several years and that helped to regulate her VB with last use of OCP a few years ago; she states she had no problems or issues with them.   No fevers, chills, nausea, vomiting, chest pain, SOB, dysuria, hematuria, diarrhea, constipation, blood in BMs, abdominal pain or current VB.  Review of Systems:  Her 12 point review of systems is negative or as noted in the History of Present Illness.  Patient Active Problem List   Diagnosis Date Noted  . Abnormal uterine bleeding 04/03/2016  . Unspecified vitamin D deficiency 02/17/2013  . Pure hypercholesterolemia 02/17/2013     Past Medical History:  Past Medical History  Diagnosis Date  . Obesity, unspecified   . Eczema     sees dr Allyson Sabal  . Head ache   . 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 Kidney stone 2013   [facility]  . Infection     UTI  . Hypertension     Past Surgical History:  Past Surgical History  Procedure Laterality Date  . Breast reduction surgery  07/2010  . Breast surgery      Past Obstetrical History:  OB History    Gravida Para Term Preterm AB TAB SAB Ectopic Multiple Living   0 0 0 0 0 0 0 0 0 0       Past Gynecological History: As per HPI. No. history of abnormal pap smears  No. history of STIs.   She is currently using nothing for contraception.   Social History:  Social History   Social History  . Marital Status: Single    Spouse Name: N/A  . Number of Children: N/A  . Years of Education: N/A   Occupational History  . 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 Smoking status: Former Research scientist (life sciences)  . Smokeless tobacco: Never Used     Comment: quit in early 20's  . Alcohol Use: Yes     Comment: occasional-monthlyt  . Drug Use: No  . Sexual Activity: Yes    Birth Control/ Protection: None   Other Topics Concern  . Not on file   Social History Narrative   Single, lives alone, no pets    Family History:  Family History  Problem  Relation Age of Onset  . Diabetes Mother   . Hypertension Mother   . Gout Mother   . Crohn's disease Mother   . Osteoporosis Mother   . Kidney disease Mother     mild, related to DM  . Hyperlipidemia Mother   . Stroke Father 50  . Hypertension Father   . Seizures Father     related to stroke  . Hyperlipidemia Father   . Kidney Stones Brother   . Hyperlipidemia Maternal Grandmother   . Cancer Neg Hx   . Heart disease Neg Hx    She denies any female cancers, bleeding or blood clotting disorders.     Medications [name] does not currently have medications on file. Current Outpatient Prescriptions  Medication Sig Dispense Refill  . megestrol (MEGACE) 20 MG tablet Take 1 tablet (20 mg total) by mouth daily. Can increase to 1 tablet twice a day if needed. 30 tablet  3  . albuterol (PROVENTIL HFA;VENTOLIN HFA) 108 (90 BASE) MCG/ACT inhaler Inhale 2 puffs into the lungs every 4 (four) hours as needed for wheezing or shortness of breath. (Patient not taking: Reported on 10/15/2015) 1 Inhaler 0  . loratadine (CLARITIN) 10 MG tablet Take 1 tablet (10 mg total) by mouth daily. (Patient not taking: Reported on 10/15/2015) 30 tablet 0  . triamcinolone ointment (KENALOG) 0.5 % Apply 1 application topically 2 (two) times daily. (Patient not taking: Reported on 10/15/2015) 60 g 1   No current facility-administered medications for this visit.    Allergies Review of patient's allergies indicates no known allergies.   Physical Exam:  BP 141/76 mmHg  Pulse 110  Wt 227 lb (102.967 kg)  LMP 03/07/2016 (Exact Date) Body mass index is 38.95 kg/(m^2). General appearance: Well nourished, well developed female in no acute distress.  Neck:  Supple, normal appearance, and no thyromegaly  Cardiovascular: normal s1 and s2.  No murmurs, rubs or gallops. Respiratory:  Clear to auscultation bilateral. Normal respiratory effort Abdomen: positive bowel sounds and no masses, hernias; diffusely non tender to palpation, non distended Neuro/Psych:  Normal mood and affect.  Skin:  Warm and dry.   Pelvic exam: deferred  Laboratory: no new labs  Radiology: none  Assessment: pt with AUB; currently stable  Plan:  Given s/s and no history of u/s, this was ordered. Also pt told to stay on megace until evaluation is complete. Will need UPT and embx at u/s follow up appointment. Pt told to abstain from intercourse until then.   Orders Placed This Encounter  Procedures  . US Pelvis Complete  . US Transvaginal Non-OB   RTC 1-3wks  Durene Romans MD Attending Center for [facility])

## 2016-04-10 ENCOUNTER — Ambulatory Visit: Payer: Self-pay | Admitting: General Practice

## 2016-04-10 ENCOUNTER — Ambulatory Visit (HOSPITAL_COMMUNITY)
Admission: RE | Admit: 2016-04-10 | Discharge: 2016-04-10 | Disposition: A | Discharge status: Home / Self Care | Payer: Self-pay | Source: Ambulatory Visit | Attending: Obstetrics and Gynecology | Admitting: Obstetrics and Gynecology

## 2016-04-10 DIAGNOSIS — N939 Abnormal uterine and vaginal bleeding, unspecified: Secondary | ICD-10-CM | POA: Insufficient documentation

## 2016-04-10 DIAGNOSIS — N390 Urinary tract infection, site not specified: Secondary | ICD-10-CM

## 2016-04-10 DIAGNOSIS — D259 Leiomyoma of uterus, unspecified: Secondary | ICD-10-CM | POA: Insufficient documentation

## 2016-04-10 NOTE — Progress Notes (Signed)
Patient came by clinic today to drop off urine sample. Last urine culture suggested recollection.

## 2016-04-12 LAB — URINE CULTURE
Colony Count: NO GROWTH
Organism ID, Bacteria: NO GROWTH

## 2016-04-14 ENCOUNTER — Ambulatory Visit (HOSPITAL_COMMUNITY): Payer: Self-pay

## 2016-04-28 ENCOUNTER — Ambulatory Visit (INDEPENDENT_AMBULATORY_CARE_PROVIDER_SITE_OTHER): Payer: Self-pay | Admitting: Obstetrics and Gynecology

## 2016-04-28 ENCOUNTER — Encounter: Payer: Self-pay | Admitting: Obstetrics and Gynecology

## 2016-04-28 ENCOUNTER — Other Ambulatory Visit (HOSPITAL_COMMUNITY)
Admission: RE | Admit: 2016-04-28 | Discharge: 2016-04-28 | Disposition: A | Discharge status: Home / Self Care | Payer: Self-pay | Source: Ambulatory Visit | Attending: Obstetrics and Gynecology | Admitting: Obstetrics and Gynecology

## 2016-04-28 VITALS — BP 121/87 | HR 90 | Wt 228.9 lb

## 2016-04-28 DIAGNOSIS — Z3202 Encounter for pregnancy test, result negative: Secondary | ICD-10-CM

## 2016-04-28 DIAGNOSIS — N939 Abnormal uterine and vaginal bleeding, unspecified: Secondary | ICD-10-CM | POA: Insufficient documentation

## 2016-04-28 LAB — POCT PREGNANCY, URINE: PREG TEST UR: NEGATIVE

## 2016-04-29 NOTE — Procedures (Signed)
Endometrial Biopsy Procedure Note  Pre-operative Diagnosis: AUB, fibroids, elevated BMI  Post-operative Diagnosis: same. ? Internal os stenosis  Indications: as abo e  Procedure Details  Urine pregnancy test was done and result was negative.  The risks (including infection, bleeding, pain, and uterine perforation) and benefits of the procedure were explained to the patient and Written informed consent was obtained.  The patient was placed in the dorsal lithotomy position.  Bimanual exam showed the uterus to be in the anteroflexed position.  A Graves' speculum inserted in the vagina, and the cervix was visualized. The cervix was then prepped with povidone iodine, and a sharp tenaculum was applied to the anterior lip of the cervix for stabilization.  A pipelle was inserted into the uterine cavity and sounded the uterus to a depth of 8cm.  A Minimal-small amount of tissue was collected after 2 passes. The sample was sent for pathologic examination.  Condition: Stable  Complications: None  Plan: The patient was advised to call for any fever or for prolonged or severe pain or bleeding. She was advised to use OTC analgesics as needed for mild to moderate pain. She was advised to avoid vaginal intercourse for 48 hours or until the bleeding has completely stopped.  ?stenosis of internal os, possibly from LUS fibroid. Will have to follow up sample results to see if uterus properly sampled.   Durene Romans MD Attending Center for [facility] [employer])

## 2016-04-29 NOTE — Progress Notes (Signed)
Obstetrics and Gynecology Visit Return Patient Evaluation  Appointment Date: 04/28/2016  Chief Complaint: u/s follow up  History of Present Illness:  See prior notes. Patient here for u/s f/u and embx for AUB. Patient still on the megace and still notes irregular VB. No s/s of anemia. Occasional discomfort. U/s today shows:  TRANSABDOMINAL AND TRANSVAGINAL ULTRASOUND OF PELVIS  TECHNIQUE: Both transabdominal and transvaginal ultrasound examinations of the pelvis were performed. Transabdominal technique was performed for global imaging of the pelvis including uterus, ovaries, adnexal regions, and pelvic cul-de-sac. It was necessary to proceed with endovaginal exam following the transabdominal exam to visualize the uterus, endometrium, ovaries, and adnexal regions.  COMPARISON: Noncontrast abdominal and pelvic CT scan of February 16, 2012  FINDINGS: Uterus  Measurements: 11.7 x 7.7 x 9.9 cm. There is a dominant superior fundal fibroid measuring 7.5 x 6.8 x 9.9 cm. A second fibroid measures 4.2 x 3.4 x 3.9 cm and lies on the right posteriorly. An anterior left-sided fibroid in the lower uterine segment to the left measures 1.9 x 1.0 x 1.8 cm.  Endometrium  Thickness: 8 mm. No focal abnormality visualized.  Right ovary  Measurements: 4.6 x 2.1 x 2.9 cm. Normal appearance/no adnexal mass.  Left ovary  Measurements: 4.0 x 2.4 x 1.9 cm. Normal appearance/no adnexal mass.  Other findings  There is a trace of free fluid.  IMPRESSION: 1. Large uterine fibroids as described. The endometrium appears normal. If bleeding remains unresponsive to hormonal or medical therapy, sonohysterogram should be considered for focal lesion work-up. (Ref: Radiological Reasoning: Algorithmic Workup of Abnormal Vaginal Bleeding with Endovaginal Sonography and Sonohysterography. AJR 2008; [id number]) 2. Normal appearance of the ovaries. There is a trace of free  pelvic fluid.   Electronically Signed  By: David Martinique M.D.  On: 04/10/2016 09:09   Review of Systems: as per HPI  Medications: megace 20 bid Allergies: has No Known Allergies.  Physical Exam:  BP 121/87 mmHg  Pulse 90  Wt 228 lb 14.4 oz (103.828 kg)  LMP 04/12/2016 Body mass index is 39.27 kg/(m^2). General appearance: Well nourished, well developed female in no acute distress.  Abdomen: diffusely non tender to palpation, non distended, and no masses, hernias Neuro/Psych:  Normal mood and affect.    Pelvic exam:  See procedure note.   Assessment: AUB, fibroid uterus; pt stable  Plan: F/u embx and continue on megace in the interim. Fibroids on my review don't appear submucosal with ES appearing thin and uniform. Hopefully was able to get past internal os endometrial sampling but if not, LUS fibroid could be cause. Minimally old blood noted on exam in vaginal vault.  D/w pt re: plan after embx is back and desire for child bearing plans.  Durene Romans MD Attending Center for [facility] [employer] [facility])

## 2016-05-01 ENCOUNTER — Encounter: Payer: Self-pay | Admitting: General Practice

## 2016-05-15 ENCOUNTER — Telehealth: Payer: Self-pay | Admitting: Obstetrics and Gynecology

## 2016-05-15 NOTE — Telephone Encounter (Signed)
GYN Telephone Note 05/15/2016 1154am Patient called at [phone] and VM identifies as the patient. VM left tell her biopsy results were negative and to talk about next steps. Pt told to call clinic.   Durene Romans MD Attending Center for [employer] [facility] [employer])

## 2016-06-02 ENCOUNTER — Emergency Department (HOSPITAL_COMMUNITY)
Admission: EM | Admit: 2016-06-02 | Discharge: 2016-06-02 | Disposition: A | Discharge status: Home / Self Care | Payer: No Typology Code available for payment source | Attending: Emergency Medicine | Admitting: Emergency Medicine

## 2016-06-02 ENCOUNTER — Encounter (HOSPITAL_COMMUNITY): Payer: Self-pay

## 2016-06-02 ENCOUNTER — Emergency Department (HOSPITAL_COMMUNITY): Payer: No Typology Code available for payment source

## 2016-06-02 DIAGNOSIS — Y92410 Unspecified street and highway as the place of occurrence of the external cause: Secondary | ICD-10-CM | POA: Insufficient documentation

## 2016-06-02 DIAGNOSIS — M542 Cervicalgia: Secondary | ICD-10-CM | POA: Diagnosis present

## 2016-06-02 DIAGNOSIS — Y9389 Activity, other specified: Secondary | ICD-10-CM | POA: Insufficient documentation

## 2016-06-02 DIAGNOSIS — Y999 Unspecified external cause status: Secondary | ICD-10-CM | POA: Insufficient documentation

## 2016-06-02 DIAGNOSIS — Z791 Long term (current) use of non-steroidal anti-inflammatories (NSAID): Secondary | ICD-10-CM | POA: Insufficient documentation

## 2016-06-02 DIAGNOSIS — S161XXA Strain of muscle, fascia and tendon at neck level, initial encounter: Secondary | ICD-10-CM | POA: Insufficient documentation

## 2016-06-02 MED ORDER — IBUPROFEN 800 MG PO TABS
800.0000 mg | ORAL_TABLET | Freq: Once | ORAL | Status: AC
  Administered 2016-06-02: 800 mg via ORAL
  Filled 2016-06-02: qty 1

## 2016-06-02 MED ORDER — ACETAMINOPHEN 500 MG PO TABS
1000.0000 mg | ORAL_TABLET | Freq: Once | ORAL | Status: AC
  Administered 2016-06-02: 1000 mg via ORAL
  Filled 2016-06-02: qty 2

## 2016-06-02 MED ORDER — OXYCODONE HCL 5 MG PO TABS
5.0000 mg | ORAL_TABLET | Freq: Once | ORAL | Status: AC
  Administered 2016-06-02: 5 mg via ORAL
  Filled 2016-06-02: qty 1

## 2016-06-02 NOTE — ED Notes (Signed)
Patient transported to X-ray 
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
## 2016-06-02 NOTE — Discharge Instructions (Signed)
Take 4 over the counter ibuprofen tablets 3 times a day or 2 over-the-counter naproxen tablets twice a day for pain. Also take tylenol 1000mg(2 extra strength) four times a day.    
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
## 2016-06-02 NOTE — ED Notes (Signed)
Discharge instructions and follow up care reviewed with patient. Patient verbalized understanding. 

## 2016-08-09 ENCOUNTER — Ambulatory Visit (HOSPITAL_COMMUNITY)
Admission: EM | Admit: 2016-08-09 | Discharge: 2016-08-09 | Disposition: A | Discharge status: Home / Self Care | Payer: Self-pay

## 2016-08-09 ENCOUNTER — Encounter (HOSPITAL_COMMUNITY): Payer: Self-pay | Admitting: *Deleted

## 2016-08-09 ENCOUNTER — Emergency Department (HOSPITAL_COMMUNITY)
Admission: EM | Admit: 2016-08-09 | Discharge: 2016-08-09 | Disposition: A | Discharge status: Home / Self Care | Payer: Self-pay | Attending: Emergency Medicine | Admitting: Emergency Medicine

## 2016-08-09 ENCOUNTER — Encounter (HOSPITAL_COMMUNITY): Payer: Self-pay | Admitting: Emergency Medicine

## 2016-08-09 DIAGNOSIS — R739 Hyperglycemia, unspecified: Secondary | ICD-10-CM

## 2016-08-09 DIAGNOSIS — B3731 Acute candidiasis of vulva and vagina: Secondary | ICD-10-CM

## 2016-08-09 DIAGNOSIS — B373 Candidiasis of vulva and vagina: Secondary | ICD-10-CM | POA: Insufficient documentation

## 2016-08-09 DIAGNOSIS — R7309 Other abnormal glucose: Secondary | ICD-10-CM

## 2016-08-09 LAB — BASIC METABOLIC PANEL
Anion gap: 11 (ref 5–15)
BUN: 7 mg/dL (ref 6–20)
CALCIUM: 9.7 mg/dL (ref 8.9–10.3)
CO2: 20 mmol/L — AB (ref 22–32)
CREATININE: 0.96 mg/dL (ref 0.44–1.00)
Chloride: 105 mmol/L (ref 101–111)
GFR calc Af Amer: >60 mL/min (ref >60)
GLUCOSE: 261 mg/dL — AB (ref 65–99)
Potassium: 3.6 mmol/L (ref 3.5–5.1)
Sodium: 136 mmol/L (ref 135–145)

## 2016-08-09 LAB — POCT URINALYSIS DIP (DEVICE)
BILIRUBIN URINE: NEGATIVE
GLUCOSE, UA: 500 mg/dL — AB
Ketones, ur: 15 mg/dL — AB
Leukocytes, UA: NEGATIVE
NITRITE: NEGATIVE
PH: 5.5 (ref 5.0–8.0)
PROTEIN: NEGATIVE mg/dL
Specific Gravity, Urine: 1.015 (ref 1.005–1.030)
Urobilinogen, UA: 0.2 mg/dL (ref 0.0–1.0)

## 2016-08-09 LAB — WET PREP, GENITAL
Clue Cells Wet Prep HPF POC: NONE SEEN
SPERM: NONE SEEN
Trich, Wet Prep: NONE SEEN
YEAST WET PREP: NONE SEEN

## 2016-08-09 LAB — CBC
HCT: 40.5 % (ref 36.0–46.0)
Hemoglobin: 13.1 g/dL (ref 12.0–15.0)
MCH: 27.2 pg (ref 26.0–34.0)
MCHC: 32.3 g/dL (ref 30.0–36.0)
MCV: 84 fL (ref 78.0–100.0)
PLATELETS: 373 10*3/uL (ref 150–400)
RBC: 4.82 MIL/uL (ref 3.87–5.11)
RDW: 14.7 % (ref 11.5–15.5)
WBC: 7.9 10*3/uL (ref 4.0–10.5)

## 2016-08-09 LAB — CBG MONITORING, ED: GLUCOSE-CAPILLARY: 278 mg/dL — AB (ref 65–99)

## 2016-08-09 LAB — I-STAT BETA HCG BLOOD, ED (MC, WL, AP ONLY): I-stat hCG, quantitative: <5 m[IU]/mL (ref <5)

## 2016-08-09 LAB — GLUCOSE, CAPILLARY: GLUCOSE-CAPILLARY: 323 mg/dL — AB (ref 65–99)

## 2016-08-09 MED ORDER — METFORMIN HCL 500 MG PO TABS
500.0000 mg | ORAL_TABLET | Freq: Once | ORAL | Status: AC
  Administered 2016-08-09: 500 mg via ORAL
  Filled 2016-08-09: qty 1

## 2016-08-09 MED ORDER — METFORMIN HCL 500 MG PO TABS: 500.0000 mg | ORAL_TABLET | Freq: Two times a day (BID) | ORAL | 1 refills | Status: DC

## 2016-08-09 MED ORDER — FLUCONAZOLE 150 MG PO TABS
150.0000 mg | ORAL_TABLET | Freq: Every day | ORAL | 0 refills | Status: AC
Start: 2016-08-09 — End: 2016-08-16

## 2016-08-09 NOTE — ED Triage Notes (Signed)
Pt also complains of vaginal itching for the past 2 days. Pt denies discharge.

## 2016-08-09 NOTE — ED Triage Notes (Signed)
Pt sent from urgent care for CBG of 323. Pt states she has not been diagnosed with diabetes. Pt states she has had increased thirst and urination for the past week.

## 2016-08-09 NOTE — ED Triage Notes (Signed)
The patient presented to the [facility] with multiple complaints.  The patient stated that she has a rash on both arms, legs, back and groin area that has been there for 1 week. The patient reported a hx of eczema.   The patient also reported urinary frequency and dysuria x 2 days.

## 2016-08-09 NOTE — ED Provider Notes (Signed)
[facility] DEPT Provider Note   CSN: [id number] Arrival date & time: 08/09/16  1501     History   Chief Complaint Chief Complaint  Patient presents with  . Hyperglycemia  . Vaginal Itching    HPI Stacey Byrd is a 35 y.o. female.  HPI [name] is a 35 y.o. female who presents to ED with complaint of polyurea, polydipsia, vaginal discharge, itching, rash to arms and legs. Pt states symptoms started just few days ago. Reports rash for weeks. Reports being on prednisone 1 month ago. Went to UC today and was sent here for blood sugar of 323. Pt denies nausea, vomiting, abdominal pain, fever, chills, dysuria. No other complaints. She is using triamcinolone cream topically to the rash areas.   Past Medical History:  Diagnosis Date  . Eczema    sees dr Allyson Sabal  . Head ache   . Infection    UTI  . Kidney stone 2013   [facility]  . 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 Obesity, unspecified     Patient Active Problem List   Diagnosis Date Noted  . Abnormal uterine bleeding 04/03/2016  . Unspecified vitamin D deficiency 02/17/2013  . Pure hypercholesterolemia 02/17/2013    Past Surgical History:  Procedure Laterality Date  . BREAST REDUCTION SURGERY  07/2010  . BREAST SURGERY      OB History    Gravida Para Term Preterm AB Living   0 0 0 0 0 0   SAB TAB Ectopic Multiple Live Births   0 0 0 0         Home Medications    Prior to Admission medications   Medication Sig Start Date End Date Taking? Authorizing Provider  albuterol (PROVENTIL HFA;VENTOLIN HFA) 108 (90 BASE) MCG/ACT inhaler Inhale 2 puffs into the lungs every 4 (four) hours as needed for wheezing or shortness of breath. Patient not taking: Reported on 06/02/2016 10/01/14   Melony Overly, MD  fluocinonide cream (LIDEX) [id number] % Apply 1 application topically 2 (two) times daily.    Historical Provider, MD  ibuprofen (ADVIL,MOTRIN) 200 MG tablet Take 400 mg by mouth every 6  (six) hours as needed for mild pain or moderate pain.    Historical Provider, MD  megestrol (MEGACE) 20 MG tablet Take 1 tablet (20 mg total) by mouth daily. Can increase to 1 tablet twice a day if needed. 10/15/15   Gwynne Edinger, MD  triamcinolone (KENALOG) 0.025 % cream Apply 1 application topically 2 (two) times daily.    Historical Provider, MD    Family History Family History  Problem Relation Age of Onset  . Diabetes Mother   . Hypertension Mother   . Gout Mother   . Crohn's disease Mother   . Osteoporosis Mother   . Kidney disease Mother     mild, related to DM  . Hyperlipidemia Mother   . Stroke Father 53  . Hypertension Father   . Seizures Father     related to stroke  . Hyperlipidemia Father   . Kidney Stones Brother   . Hyperlipidemia Maternal Grandmother   . Cancer Neg Hx   . Heart disease Neg Hx     Social History Social History  Substance Use Topics  . Smoking status: Never Smoker  . Smokeless tobacco: Never Used     Comment: quit in early 20's  . Alcohol use Yes     Comment: occasional-monthly     Allergies  Review of patient's allergies indicates no known allergies.   Review of Systems Review of Systems   Physical Exam Updated Vital Signs BP 142/76 (BP Location: Right Arm)   Pulse 107   Temp 98.3 F (36.8 C) (Oral)   Resp 18   Wt 104.3 kg   LMP 08/09/2016 (Exact Date)   SpO2 99%   BMI 39.48 kg/m   Physical Exam  Constitutional: She appears well-developed and well-nourished. No distress.  HENT:  Head: Normocephalic.  Eyes: Conjunctivae are normal.  Neck: Neck supple.  Cardiovascular: Normal rate, regular rhythm and normal heart sounds.   Pulmonary/Chest: Effort normal and breath sounds normal. No respiratory distress. She has no wheezes. She has no rales.  Abdominal: Soft. Bowel sounds are normal. She exhibits no distension. There is no tenderness. There is no rebound.  Genitourinary:  Genitourinary Comments:  Erythema with  skin dryness to the external genitalia to labia majora.  Normal labia minora.  Normal vaginal canal.  Small blood in vaginal canal.  Cervix is normal.  No cervical motion tenderness.  No uterine or adnexal tenderness.  Musculoskeletal: She exhibits no edema.  Neurological: She is alert.  Skin: Skin is warm and dry.  Multiple patches of the dry skin with some erythema over forearms and lower legs.  No cellulitic changes.  No drainage.  No tenderness to palpation.  Psychiatric: She has a normal mood and affect. Her behavior is normal.  Nursing note and vitals reviewed.    ED Treatments / Results  Labs (all labs ordered are listed, but only abnormal results are displayed) Labs Reviewed  WET PREP, GENITAL - Abnormal; Notable for the following:       Result Value   WBC, Wet Prep HPF POC MODERATE (*)    All other components within normal limits  BASIC METABOLIC PANEL - Abnormal; Notable for the following:    CO2 20 (*)    Glucose, Bld 261 (*)    All other components within normal limits  CBG MONITORING, ED - Abnormal; Notable for the following:    Glucose-Capillary 278 (*)    All other components within normal limits  CBC  CBG MONITORING, ED  I-STAT BETA HCG BLOOD, ED ([facility], [facility], [facility] ONLY)  GC/CHLAMYDIA PROBE AMP ([place]) NOT AT [facility]    EKG  EKG Interpretation None       Radiology No results found.  Procedures Procedures (including critical care time)  Medications Ordered in ED Medications  metFORMIN (GLUCOPHAGE) tablet 500 mg (not administered)     Initial Impression / Assessment and Plan / ED Course  I have reviewed the triage vital signs and the nursing notes.  Pertinent labs & imaging results that were available during my care of the patient were reviewed by me and considered in my medical decision making (see chart for details).  Clinical Course    Patient emerges the department with elevated blood sugar, sent from urgent care.  Patient's blood sugar upon  arrival was 278.  She denies history of diabetes.  She does have history of diabetes in her family.  She is in no acute distress, no major complaints and some itching in vaginal area.  Some Candida infection noted of the labia majora bilaterally.  Normal pelvic exam  Otherwise. Pt's electrolytes with no significant abnormalities. preg test neg. Plan to start on metformin. Diflucan for yeast. Follow up with pcp. Diet and exercise discussed.   Vitals:   08/09/16 1519  BP: 142/76  Pulse: 107  Resp: 18  Temp: 98.3 F (36.8 C)  TempSrc: Oral  SpO2: 99%  Weight: 104.3 kg     Final Clinical Impressions(s) / ED Diagnoses   Final diagnoses:  Hyperglycemia  Yeast vaginitis    New Prescriptions New Prescriptions   FLUCONAZOLE (DIFLUCAN) 150 MG TABLET    Take 1 tablet (150 mg total) by mouth daily.   METFORMIN (GLUCOPHAGE) 500 MG TABLET    Take 1 tablet (500 mg total) by mouth 2 (two) times daily with a meal.     Jeannett Senior, PA-C 08/09/16 [place], MD 08/10/16 0021

## 2016-08-09 NOTE — Discharge Instructions (Signed)
Go to the ED for evaluation of blood sugar. Apply triamcinolone to affect areas on upper arms. Decreasing showers to once a day or every other day will help.

## 2016-08-09 NOTE — ED Provider Notes (Signed)
CSN: [id number]     Arrival date & time 08/09/16  1225 History   None    Chief Complaint  Patient presents with  . Rash  . Dysuria   (Consider location/radiation/quality/duration/timing/severity/associated sxs/prior Treatment) 35 y.o. female presents with vaginal itching with urination. Patient denies pain with urination, increased frequency but states that she has been tired and has been "thirsty a lot" . Condition is made better by nothng. Condition is made worse by nothing. Patient denies any treatment prior to there arrival at this facility. Patient has no know history of diabetes  Patient also presents with and rash to upper extremities. Patient has a history of eczema.  Condition is acute in nature. Condition is made better by hydrocortisone. Condition is made worse by nothing.       Past Medical History:  Diagnosis Date  . Eczema    sees dr Stacey Byrd  . Head ache   . Infection    UTI  . Kidney stone 2013   [facility]  . 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 Obesity, unspecified    Past Surgical History:  Procedure Laterality Date  . BREAST REDUCTION SURGERY  07/2010  . BREAST SURGERY     Family History  Problem Relation Age of Onset  . Diabetes Mother   . Hypertension Mother   . Gout Mother   . Crohn's disease Mother   . Osteoporosis Mother   . Kidney disease Mother     mild, related to DM  . Hyperlipidemia Mother   . Stroke Father 54  . Hypertension Father   . Seizures Father     related to stroke  . Hyperlipidemia Father   . Kidney Stones Brother   . Hyperlipidemia Maternal Grandmother   . Cancer Neg Hx   . Heart disease Neg Hx    Social History  Substance Use Topics  . Smoking status: Never Smoker  . Smokeless tobacco: Never Used     Comment: quit in early 20's  . Alcohol use Yes     Comment: occasional-monthly   OB History    Gravida Para Term Preterm AB Living   0 0 0 0 0 0   SAB TAB Ectopic Multiple Live Births   0 0 0  0       Review of Systems  Constitutional: Positive for fatigue.  Endocrine: Positive for polyuria.  Genitourinary:       Itching with urination   Skin: Positive for rash ( to upper arms).    Allergies  Review of patient's allergies indicates no known allergies.  Home Medications   Prior to Admission medications   Medication Sig Start Date End Date Taking? Authorizing Provider  megestrol (MEGACE) 20 MG tablet Take 1 tablet (20 mg total) by mouth daily. Can increase to 1 tablet twice a day if needed. 10/15/15  Yes Stacey Edinger, MD  triamcinolone (KENALOG) 0.025 % cream Apply 1 application topically 2 (two) times daily.   Yes Historical Provider, MD  albuterol (PROVENTIL HFA;VENTOLIN HFA) 108 (90 BASE) MCG/ACT inhaler Inhale 2 puffs into the lungs every 4 (four) hours as needed for wheezing or shortness of breath. Patient not taking: Reported on 06/02/2016 10/01/14   Stacey Overly, MD  fluocinonide cream (LIDEX) [id number] % Apply 1 application topically 2 (two) times daily.    Historical Provider, MD  ibuprofen (ADVIL,MOTRIN) 200 MG tablet Take 400 mg by mouth every 6 (six) hours as needed for mild pain or moderate  pain.    Historical Provider, MD   Meds Ordered and Administered this Visit  Medications - No data to display  BP 132/82 (BP Location: Left Arm)   Pulse 106   Temp 98.9 F (37.2 C) (Oral)   Resp 18   LMP 08/09/2016 (Exact Date)   SpO2 100%  No data found.   Physical Exam  Constitutional: She is oriented to person, place, and time. She appears well-developed and well-nourished.  HENT:  Head: Normocephalic and atraumatic.  Eyes: Conjunctivae are normal.  Neck: Normal range of motion.  Cardiovascular: Normal rate and regular rhythm.   Pulmonary/Chest: Effort normal and breath sounds normal.  Musculoskeletal: Normal range of motion.  Neurological: She is alert and oriented to person, place, and time.  Skin: Skin is warm.  Psychiatric: She has a normal mood and  affect.  Nursing note and vitals reviewed.   Urgent Care Course   Clinical Course    Procedures (including critical care time)  Labs Review Labs Reviewed  GLUCOSE, CAPILLARY - Abnormal; Notable for the following:       Result Value   Glucose-Capillary 323 (*)    All other components within normal limits  POCT URINALYSIS DIP (DEVICE) - Abnormal; Notable for the following:    Glucose, UA 500 (*)    Ketones, ur 15 (*)    Hgb urine dipstick LARGE (*)    All other components within normal limits    Imaging Review No results found.   Visual Acuity Review  Right Eye Distance:   Left Eye Distance:   Bilateral Distance:    Right Eye Near:   Left Eye Near:    Bilateral Near:         MDM   1. Blood sugar increased        Stacey Mau, NP 08/09/16 1441

## 2016-08-09 NOTE — Discharge Instructions (Signed)
Start metformin daily. Watch your diet and start exercising for at least 30 min 5 times a week. Diflucan for yeast infection. You can also use over the counter yeast topical creams. Follow up with family doctor.

## 2016-08-09 NOTE — ED Notes (Signed)
Unable to collect labs PA is in the room talking with patient

## 2016-08-11 LAB — GC/CHLAMYDIA PROBE AMP (~~LOC~~) NOT AT ARMC
CHLAMYDIA, DNA PROBE: NEGATIVE
NEISSERIA GONORRHEA: NEGATIVE

## 2016-08-12 ENCOUNTER — Telehealth: Payer: Self-pay | Admitting: *Deleted

## 2016-08-12 NOTE — Telephone Encounter (Signed)
Pt left message yesterday @ 1952 stating that she is responding to a message that Dr. Ilda Basset left for her a couple months ago. Based on his message she is not sure if he just wanted to talk to her via phone regarding plan of care options or if she needed another appt. Message sent to Dr. Ilda Basset regarding pt's phone call.

## 2016-08-13 NOTE — Telephone Encounter (Signed)
Please have her schedule an appointment with me for plan of care discussion, since it's been so long since the last visit. thanks

## 2016-08-13 NOTE — Telephone Encounter (Signed)
Called pt and left message on her personal voice mail stating that Dr. Ilda Basset would like her to schedule appt for follow up and to discuss plan of care options. A staff member will call her with appt details.

## 2016-08-14 ENCOUNTER — Telehealth: Payer: Self-pay

## 2016-08-14 NOTE — Telephone Encounter (Addendum)
Pt called requesting a refill on her Megace until her appt she has scheduled on 09/22/16 for follow up.  LM for pt to return call in regards to refill of medication.  Per Dr. Ilda Basset pt can have refill on Megace 20 mg tablet po bid until her appt in December.  Provider wanted to find out what direction she would like to go in regards to managing her AUB.

## 2016-08-18 MED ORDER — MEGESTROL ACETATE 20 MG PO TABS: 20.0000 mg | ORAL_TABLET | Freq: Two times a day (BID) | ORAL | 1 refills | Status: DC

## 2016-08-18 NOTE — Telephone Encounter (Signed)
Contacted pt and informed her that provider approved refill on Megace until her appt in December.  I also asked pt what she was interested in doing to maintain her bleeding.  Pt stated that she was not sure of what she wanted to do to manage her bleeding another reason why she is looking forward to the appt.  I told that it is understandable and that she could look into reputable website in regards to options on managing her bleeding so that she can bring questions to her appt.  Pt stated understanding with no further questions.

## 2016-09-02 ENCOUNTER — Telehealth: Payer: Self-pay | Admitting: *Deleted

## 2016-09-02 NOTE — Telephone Encounter (Addendum)
Pt left message yesterday requesting to know the date of her next appt. If it is not on 12/6, she would like it to be changed to that day. Per chart review, pt's appt is on 12/4.  Message sent to scheduling staff to change if possible.   11/15  1030  Called pt and informed her that her appt has been changed to 12/6 @ 1340.  Pt voiced understanding.

## 2016-09-22 ENCOUNTER — Ambulatory Visit: Payer: Self-pay | Admitting: Obstetrics and Gynecology

## 2016-09-24 ENCOUNTER — Ambulatory Visit: Payer: Self-pay | Admitting: Obstetrics and Gynecology

## 2016-09-29 ENCOUNTER — Ambulatory Visit: Payer: Self-pay | Admitting: Obstetrics and Gynecology

## 2016-09-29 ENCOUNTER — Encounter: Payer: Self-pay | Admitting: Obstetrics and Gynecology

## 2016-09-29 ENCOUNTER — Ambulatory Visit (INDEPENDENT_AMBULATORY_CARE_PROVIDER_SITE_OTHER): Payer: Self-pay | Admitting: Obstetrics and Gynecology

## 2016-09-29 DIAGNOSIS — N840 Polyp of corpus uteri: Secondary | ICD-10-CM

## 2016-09-29 MED ORDER — MEGESTROL ACETATE 20 MG PO TABS: 20.0000 mg | ORAL_TABLET | Freq: Every day | ORAL | 2 refills | Status: DC

## 2016-09-29 NOTE — Progress Notes (Addendum)
Obstetrics and Gynecology Visit Return Patient Evaluation  Appointment Date: 09/29/2016  OBGYN Clinic: [facility]  Chief Complaint: AUB follow up History of Present Illness: Stacey Byrd is a 35 y.o. African-American P0, seen for the above chief complaint. Her past medical history is significant for HTN, BMI 39, h/o HAs, hyperlipidemia, DM2  Last seen for embx on 7/11 and had benign path but with polyps noted and I was unable to get in contact with her. Patient called in the interim and refill on megace sent in for continue AUB until could be seen again in the office.  Patient states she's taking megace 20mg  qday and hasn't had any bleeding since a few days after taking it. She states the bleeding looks like old blood, isn't every day and not heavy and no pain associated with it, when she does have bleeding  No nausea, vomiting, fevers, chills, dysuria, vaginal discharge or itching.     Review of Systems: as noted in the History of Present Illness.  Past Medical History:  Past Medical History:  Diagnosis Date  . Diabetes mellitus without complication ([place])   . Eczema    sees dr Allyson Sabal  . Head ache   . Infection    UTI  . Kidney stone 2013   [facility]  . 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 Obesity, unspecified     Past Surgical History:  Past Surgical History:  Procedure Laterality Date  . BREAST REDUCTION SURGERY  07/2010  . BREAST SURGERY      Past Obstetrical History:  OB History  Gravida Para Term Preterm AB Living  0 0 0 0 0 0  SAB TAB Ectopic Multiple Live Births  0 0 0 0         Past Gynecological History: As per HPI. No history of abnormal pap smears. Last pap 2014 and cytology and hpv negative.  No history of STIs.   She is currently using nothing for contraception.   Social History:  Social History   Social History  . Marital status: Single    Spouse name: N/A  . Number of children: N/A  . Years of  education: N/A   Occupational History  . 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 Smoking status: Never Smoker  . Smokeless tobacco: Never Used     Comment: quit in early 20's  . Alcohol use Yes     Comment: occasional-monthly  . Drug use: No  . Sexual activity: Yes    Birth control/ protection: None   Other Topics Concern  . Not on file   Social History Narrative   Single, lives alone, no pets    Family History:  Family History  Problem Relation Age of Onset  . Diabetes Mother   . Hypertension Mother   . Gout Mother   . Crohn's disease Mother   . Osteoporosis Mother   . Kidney disease Mother     mild, related to DM  . Hyperlipidemia Mother   . Stroke Father 67  . Hypertension Father   . Seizures Father     related to stroke  . Hyperlipidemia Father   . Kidney Stones Brother   . Hyperlipidemia Maternal Grandmother   . Cancer Neg Hx   . Heart disease Neg Hx     Medications Ms. Witucki had no medications administered during this visit. Current Outpatient Prescriptions  Medication Sig  Dispense Refill  . megestrol (MEGACE) 20 MG tablet Take 1 tablet (20 mg total) by mouth daily. 60 tablet 2  . metFORMIN (GLUCOPHAGE) 500 MG tablet Take 1 tablet (500 mg total) by mouth 2 (two) times daily with a meal. 60 tablet 1  . ibuprofen (ADVIL,MOTRIN) 200 MG tablet Take 600 mg by mouth every 6 (six) hours as needed for mild pain or moderate pain.     Marland Kitchen triamcinolone (KENALOG) 0.025 % cream Apply 1 application topically 2 (two) times daily.     No current facility-administered medications for this visit.     Allergies Patient has no known allergies.   Physical Exam:  BP (!) 114/57   Pulse 82   Wt 225 lb 3.2 oz (102.2 kg)   BMI 38.66 kg/m  Body mass index is 38.66 kg/m. General appearance: Well nourished, well developed female in no acute distress.   Laboratory: no new imaging  Radiology: no new  imaging  Assessment: pt doing well  Plan:  D/w her re: options moving forward. I told her that proceeding with h/s and D&C, polypectomy would be recommended in an ideal world but she is responding fine to oral progestins and pathology benign so would recommend staying on oral progestins at this point. D/w her also possibility of difficulties getting pregnant/early SAB with endometrial polyps, as she may want to try in a year or two. D/w her that would recommend evaluation for that pre pregnancy but also reasonable to try and get pregnant and sees what happens but would recommend DM2 is optimized and start PNV a month or two in advance.  I told her I'd recommend having a withdrawal bleed with the megace qmonth or once every other month routinely which she is amenable to.   RTC PRN  >50% of encounter as face to face and counseling.   Durene Romans MD Attending Center for [employer])

## 2016-11-16 ENCOUNTER — Ambulatory Visit (HOSPITAL_COMMUNITY)
Admission: EM | Admit: 2016-11-16 | Discharge: 2016-11-16 | Disposition: A | Discharge status: Home / Self Care | Payer: BLUE CROSS/BLUE SHIELD | Attending: Family Medicine | Admitting: Family Medicine

## 2016-11-16 ENCOUNTER — Encounter (HOSPITAL_COMMUNITY): Payer: Self-pay | Admitting: Emergency Medicine

## 2016-11-16 DIAGNOSIS — J00 Acute nasopharyngitis [common cold]: Secondary | ICD-10-CM | POA: Diagnosis not present

## 2016-11-16 DIAGNOSIS — R05 Cough: Secondary | ICD-10-CM | POA: Diagnosis not present

## 2016-11-16 DIAGNOSIS — R059 Cough, unspecified: Secondary | ICD-10-CM

## 2016-11-16 MED ORDER — BENZONATATE 100 MG PO CAPS: 200.0000 mg | ORAL_CAPSULE | Freq: Three times a day (TID) | ORAL | 0 refills | Status: DC | PRN

## 2016-11-16 MED ORDER — IPRATROPIUM BROMIDE 0.06 % NA SOLN
2.0000 | Freq: Four times a day (QID) | NASAL | 0 refills | Status: DC
Start: 2016-11-16 — End: 2018-07-26

## 2016-11-16 NOTE — ED Provider Notes (Signed)
CSN: [id number]     Arrival date & time 11/16/16  1415 History   First MD Initiated Contact with Patient 11/16/16 1612     Chief Complaint  Patient presents with  . URI   (Consider location/radiation/quality/duration/timing/severity/associated sxs/prior Treatment) Patient c/o sneezing and coughing    The history is provided by the patient.  URI  Presenting symptoms: congestion and cough   Severity:  Mild Duration:  2 days Timing:  Constant Chronicity:  New Relieved by:  Nothing Worsened by:  Nothing Ineffective treatments:  None tried   Past Medical History:  Diagnosis Date  . Abnormal uterine bleeding 04/03/2016  . Diabetes mellitus without complication ([place])   . Eczema    sees dr Allyson Sabal  . Head ache   . Infection    UTI  . Kidney stone 2013   [facility]  . 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 Obesity, unspecified    Past Surgical History:  Procedure Laterality Date  . BREAST REDUCTION SURGERY  07/2010  . BREAST SURGERY     Family History  Problem Relation Age of Onset  . Diabetes Mother   . Hypertension Mother   . Gout Mother   . Crohn's disease Mother   . Osteoporosis Mother   . Kidney disease Mother     mild, related to DM  . Hyperlipidemia Mother   . Stroke Father 51  . Hypertension Father   . Seizures Father     related to stroke  . Hyperlipidemia Father   . Kidney Stones Brother   . Hyperlipidemia Maternal Grandmother   . Cancer Neg Hx   . Heart disease Neg Hx    Social History  Substance Use Topics  . Smoking status: Never Smoker  . Smokeless tobacco: Never Used     Comment: quit in early 20's  . Alcohol use Yes     Comment: occasional-monthly   OB History    Gravida Para Term Preterm AB Living   0 0 0 0 0 0   SAB TAB Ectopic Multiple Live Births   0 0 0 0       Review of Systems  Constitutional: Negative.   HENT: Positive for congestion.   Eyes: Negative.   Respiratory: Positive for cough.    Cardiovascular: Negative.   Gastrointestinal: Negative.   Endocrine: Negative.   Genitourinary: Negative.   Musculoskeletal: Negative.   Allergic/Immunologic: Negative.   Neurological: Negative.   Hematological: Negative.   Psychiatric/Behavioral: Negative.     Allergies  Patient has no known allergies.  Home Medications   Prior to Admission medications   Medication Sig Start Date End Date Taking? Authorizing Provider  benzonatate (TESSALON) 100 MG capsule Take 2 capsules (200 mg total) by mouth 3 (three) times daily as needed for cough. 11/16/16   Lysbeth Penner, FNP  ibuprofen (ADVIL,MOTRIN) 200 MG tablet Take 600 mg by mouth every 6 (six) hours as needed for mild pain or moderate pain.     Historical Provider, MD  ipratropium (ATROVENT) 0.06 % nasal spray Place 2 sprays into both nostrils 4 (four) times daily. 11/16/16   Lysbeth Penner, FNP  megestrol (MEGACE) 20 MG tablet Take 1 tablet (20 mg total) by mouth daily. 09/29/16   Aletha Halim, MD  metFORMIN (GLUCOPHAGE) 500 MG tablet Take 1 tablet (500 mg total) by mouth 2 (two) times daily with a meal. 08/09/16   Tatyana Kirichenko, PA-C  triamcinolone (KENALOG) 0.025 % cream Apply 1 application topically  2 (two) times daily.    Historical Provider, MD   Meds Ordered and Administered this Visit  Medications - No data to display  BP 124/74 (BP Location: Left Arm)   Pulse 101   Temp 98.6 F (37 C) (Oral)   Resp 18   SpO2 100%  No data found.   Physical Exam  Constitutional: She appears well-developed and well-nourished.  HENT:  Head: Normocephalic and atraumatic.  Right Ear: External ear normal.  Left Ear: External ear normal.  Mouth/Throat: Oropharynx is clear and moist.  Eyes: Conjunctivae and EOM are normal. Pupils are equal, round, and reactive to light.  Neck: Normal range of motion. Neck supple.  Cardiovascular: Normal rate, regular rhythm and normal heart sounds.   Pulmonary/Chest: Effort normal and breath  sounds normal.  Abdominal: Soft. Bowel sounds are normal.  Musculoskeletal: Normal range of motion.  Nursing note and vitals reviewed.   Urgent Care Course     Procedures (including critical care time)  Labs Review Labs Reviewed - No data to display  Imaging Review No results found.   Visual Acuity Review  Right Eye Distance:   Left Eye Distance:   Bilateral Distance:    Right Eye Near:   Left Eye Near:    Bilateral Near:         MDM   1. Acute nasopharyngitis   2. Cough    Tessalon Atrovent nasal spray  Push po fluids, rest, tylenol and motrin otc prn as directed for fever, arthralgias, and myalgias.  Follow up prn if sx's continue or persist.    Lysbeth Penner, FNP 11/16/16 1626

## 2016-11-16 NOTE — ED Triage Notes (Signed)
Sneezing congestion, see s/s

## 2016-12-28 ENCOUNTER — Ambulatory Visit (HOSPITAL_COMMUNITY)
Admission: EM | Admit: 2016-12-28 | Discharge: 2016-12-28 | Disposition: A | Discharge status: Home / Self Care | Payer: BLUE CROSS/BLUE SHIELD | Attending: Nurse Practitioner | Admitting: Nurse Practitioner

## 2016-12-28 ENCOUNTER — Encounter (HOSPITAL_COMMUNITY): Payer: Self-pay | Admitting: *Deleted

## 2016-12-28 DIAGNOSIS — J0100 Acute maxillary sinusitis, unspecified: Secondary | ICD-10-CM | POA: Diagnosis not present

## 2016-12-28 MED ORDER — AMOXICILLIN-POT CLAVULANATE 875-125 MG PO TABS: 1.0000 | ORAL_TABLET | Freq: Two times a day (BID) | ORAL | 0 refills | Status: AC

## 2016-12-28 NOTE — Discharge Instructions (Signed)
You may use OTC delsym and Robitussin for cough. You may get mucinex for chest congestion. You may use Afrin nasal spray for nasal congestion.

## 2016-12-28 NOTE — ED Provider Notes (Signed)
CSN: [id number]     Arrival date & time 12/28/16  1422 History   First MD Initiated Contact with Patient 12/28/16 1450     Chief Complaint  Patient presents with  . Facial Pain   (Consider location/radiation/quality/duration/timing/severity/associated sxs/prior Treatment) Patient is a well-appearing 36 year old female, presents today for 4 day duration of nasal congestion, chest congestion, sneezing, coughing and bilateral maxillary sinus pain. Symptoms has been off-and-on but otherwise unchanged. She denies fever, wheezing, shortness of breath, dizziness or headache. She also complaints of chest soreness with deep breath and cough. She  have tried several various over-the-counter medications without relief. 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 She denies any alleviating or aggravating factors.      Past Medical History:  Diagnosis Date  . Abnormal uterine bleeding 04/03/2016  . Diabetes mellitus without complication ([place])   . Eczema    sees dr Allyson Sabal  . Head ache   . Infection    UTI  . Kidney stone 2013   [facility]  . 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 Obesity, unspecified    Past Surgical History:  Procedure Laterality Date  . BREAST REDUCTION SURGERY  07/2010  . BREAST SURGERY     Family History  Problem Relation Age of Onset  . Diabetes Mother   . Hypertension Mother   . Gout Mother   . Crohn's disease Mother   . Osteoporosis Mother   . Kidney disease Mother     mild, related to DM  . Hyperlipidemia Mother   . Stroke Father 55  . Hypertension Father   . Seizures Father     related to stroke  . Hyperlipidemia Father   . Kidney Stones Brother   . Hyperlipidemia Maternal Grandmother   . Cancer Neg Hx   . Heart disease Neg Hx    Social History  Substance Use Topics  . Smoking status: Never Smoker  . Smokeless tobacco: Never Used     Comment: quit in early 20's  . Alcohol use Yes     Comment:  occasional-monthly   OB History    Gravida Para Term Preterm AB Living   0 0 0 0 0 0   SAB TAB Ectopic Multiple Live Births   0 0 0 0       Review of Systems  Constitutional:       Stated in history of present illness.    Allergies  Patient has no known allergies.  Home Medications   Prior to Admission medications   Medication Sig Start Date End Date Taking? Authorizing Provider  metFORMIN (GLUCOPHAGE) 500 MG tablet Take 1 tablet (500 mg total) by mouth 2 (two) times daily with a meal. 08/09/16  Yes Tatyana Kirichenko, PA-C  amoxicillin-clavulanate (AUGMENTIN) 875-125 MG tablet Take 1 tablet by mouth 2 (two) times daily. 12/28/16 01/04/17  Barry Dienes, NP  benzonatate (TESSALON) 100 MG capsule Take 2 capsules (200 mg total) by mouth 3 (three) times daily as needed for cough. 11/16/16   Lysbeth Penner, FNP  ibuprofen (ADVIL,MOTRIN) 200 MG tablet Take 600 mg by mouth every 6 (six) hours as needed for mild pain or moderate pain.     Historical Provider, MD  ipratropium (ATROVENT) 0.06 % nasal spray Place 2 sprays into both nostrils 4 (four) times daily. 11/16/16   Lysbeth Penner, FNP  megestrol (MEGACE) 20 MG tablet Take 1 tablet (20 mg total) by mouth daily. 09/29/16   Aletha Halim,  MD  triamcinolone (KENALOG) 0.025 % cream Apply 1 application topically 2 (two) times daily.    Historical Provider, MD   Meds Ordered and Administered this Visit  Medications - No data to display  BP 144/72   Temp 98.9 F (37.2 C)   Resp 16   SpO2 99%  No data found.   Physical Exam  Constitutional: She is oriented to person, place, and time. She appears well-developed and well-nourished.  HENT:  Head: Normocephalic and atraumatic.  Right Ear: External ear normal.  Left Ear: External ear normal.  Nose: Nose normal.  Mouth/Throat: Oropharynx is clear and moist. No oropharyngeal exudate.  TM pearly gray bilaterally. Bilateral maxillary sinuses are tender to percuss.  Eyes: Conjunctivae are  normal. Pupils are equal, round, and reactive to light.  Neck: Normal range of motion. Neck supple.  Cardiovascular: Normal rate, regular rhythm and normal heart sounds.   Center chest is tender to palpate.  Pulmonary/Chest: Effort normal and breath sounds normal.  Abdominal: Soft. Bowel sounds are normal. She exhibits no distension. There is no tenderness.  Lymphadenopathy:    She has no cervical adenopathy.  Neurological: She is alert and oriented to person, place, and time.  Skin: Skin is warm and dry. No rash noted.  Psychiatric: She has a normal mood and affect.  Nursing note and vitals reviewed.   Urgent Care Course     Procedures (including critical care time)  Labs Review Labs Reviewed - No data to display  Imaging Review No results found.   MDM   1. Acute non-recurrent maxillary sinusitis    She most likely has an upper respiratory infection but I will treat patient empirically for sinusitis today with Augmentin twice a day 7 days. Cough medication was offered but patient declined. Patient may use over-the-counter Robitussin or Delsym for cough. Also suggested to use over-the-counter Mucinex for her chest congestion and Afrin nasal spray for nasal congestion. Patient informed to follow with PCP if no improvement is noted by next week.    Barry Dienes, NP 12/28/16 [phone]

## 2016-12-28 NOTE — ED Triage Notes (Signed)
C/O sinus congestion and pressure x 1 wk without fevers.

## 2017-06-06 ENCOUNTER — Encounter (HOSPITAL_COMMUNITY): Payer: Self-pay | Admitting: *Deleted

## 2017-06-06 ENCOUNTER — Ambulatory Visit (HOSPITAL_COMMUNITY)
Admission: EM | Admit: 2017-06-06 | Discharge: 2017-06-06 | Disposition: A | Discharge status: Home / Self Care | Payer: BLUE CROSS/BLUE SHIELD | Attending: Internal Medicine | Admitting: Internal Medicine

## 2017-06-06 DIAGNOSIS — J069 Acute upper respiratory infection, unspecified: Secondary | ICD-10-CM | POA: Diagnosis not present

## 2017-06-06 NOTE — ED Triage Notes (Signed)
Sore throat, cough, and congestion x 3 days. No fevers.

## 2017-06-06 NOTE — Discharge Instructions (Signed)
1) Continue to treat symptomatically with over the counter products 2) You may do Delsym or Robitussin for cough 3) For sore throat, you may do Honey, throat lozenges, or chloraseptic spray if needed.

## 2017-06-06 NOTE — ED Provider Notes (Signed)
[name]    CSN: [id number] Arrival date & time: 06/06/17  1201     History   Chief Complaint Chief Complaint  Patient presents with  . Nasal Congestion  . Cough  . Sore Throat    HPI Stacey Byrd is a 36 y.o. female.   37 y.o. Female, presents today for 4-day duration of cold symptoms with sore throat, nasal congestion and coughing. Symptoms comes and goes. She have been using Dayquil with no relief. She denies sick exposure. She denies fever. ROS otherwise unremarkable.         Past Medical History:  Diagnosis Date  . Abnormal uterine bleeding 04/03/2016  . Diabetes mellitus without complication ([place])   . Eczema    sees dr Allyson Sabal  . Head ache   . Infection    UTI  . Kidney stone 2013   [facility]  . 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 Obesity, unspecified     Patient Active Problem List   Diagnosis Date Noted  . Endometrial polyp 09/29/2016  . Unspecified vitamin D deficiency 02/17/2013  . Pure hypercholesterolemia 02/17/2013    Past Surgical History:  Procedure Laterality Date  . BREAST REDUCTION SURGERY  07/2010  . BREAST SURGERY      OB History    Gravida Para Term Preterm AB Living   0 0 0 0 0 0   SAB TAB Ectopic Multiple Live Births   0 0 0 0         Home Medications    Prior to Admission medications   Medication Sig Start Date End Date Taking? Authorizing Provider  metFORMIN (GLUCOPHAGE) 500 MG tablet Take 1 tablet (500 mg total) by mouth 2 (two) times daily with a meal. 08/09/16  Yes Kirichenko, Tatyana, PA-C  benzonatate (TESSALON) 100 MG capsule Take 2 capsules (200 mg total) by mouth 3 (three) times daily as needed for cough. 11/16/16   Lysbeth Penner, FNP  ibuprofen (ADVIL,MOTRIN) 200 MG tablet Take 600 mg by mouth every 6 (six) hours as needed for mild pain or moderate pain.     [provider]  ipratropium (ATROVENT) 0.06 % nasal spray Place 2 sprays into both nostrils 4 (four)  times daily. 11/16/16   Lysbeth Penner, FNP  megestrol (MEGACE) 20 MG tablet Take 1 tablet (20 mg total) by mouth daily. 09/29/16   Aletha Halim, MD  triamcinolone (KENALOG) 0.025 % cream Apply 1 application topically 2 (two) times daily.    [provider]    Family History Family History  Problem Relation Age of Onset  . Diabetes Mother   . Hypertension Mother   . Gout Mother   . Crohn's disease Mother   . Osteoporosis Mother   . Kidney disease Mother        mild, related to DM  . Hyperlipidemia Mother   . Stroke Father 34  . Hypertension Father   . Seizures Father        related to stroke  . Hyperlipidemia Father   . Kidney Stones Brother   . Hyperlipidemia Maternal Grandmother   . Cancer Neg Hx   . Heart disease Neg Hx     Social History Social History  Substance Use Topics  . Smoking status: Never Smoker  . Smokeless tobacco: Never Used     Comment: quit in early 20's  . Alcohol use Yes     Comment: occasional-monthly     Allergies  Patient has no known allergies.   Review of Systems Review of Systems  Constitutional: Positive for fatigue. Negative for chills and fever.  HENT: Positive for congestion and sore throat.   Respiratory: Positive for cough. Negative for shortness of breath and wheezing.   Cardiovascular: Negative for chest pain and palpitations.  Gastrointestinal: Negative for abdominal pain, nausea and vomiting.  Skin: Negative for rash.  Neurological: Negative for dizziness and headaches.     Physical Exam Triage Vital Signs ED Triage Vitals  Enc Vitals Group     BP 06/06/17 1216 (!) 141/81     Pulse Rate 06/06/17 1216 94     Resp 06/06/17 1216 17     Temp 06/06/17 1216 98.9 F (37.2 C)     Temp Source 06/06/17 1216 Oral     SpO2 06/06/17 1216 98 %     Weight --      Height --      Head Circumference --      Peak Flow --      Pain Score 06/06/17 1217 2     Pain Loc --      Pain Edu? --      Excl. in [place]? --     No data found.   Updated Vital Signs BP (!) 141/81 (BP Location: Right Arm)   Pulse 94   Temp 98.9 F (37.2 C) (Oral)   Resp 17   SpO2 98%   Visual Acuity Right Eye Distance:   Left Eye Distance:   Bilateral Distance:    Right Eye Near:   Left Eye Near:    Bilateral Near:     Physical Exam  Constitutional: She is oriented to person, place, and time. She appears well-developed and well-nourished.  HENT:  Head: Normocephalic and atraumatic.  Right Ear: External ear normal.  Left Ear: External ear normal.  Nose: Nose normal.  Mouth/Throat: Oropharynx is clear and moist. No oropharyngeal exudate.  TM normal bilaterally   Eyes: Pupils are equal, round, and reactive to light.  Neck: Normal range of motion. Neck supple.  Cardiovascular: Normal rate, regular rhythm and normal heart sounds.   Pulmonary/Chest: Effort normal and breath sounds normal. She has no wheezes.  Abdominal: Soft. Bowel sounds are normal. There is no tenderness.  Lymphadenopathy:    She has no cervical adenopathy.  Neurological: She is alert and oriented to person, place, and time.  Skin: Skin is warm and dry.  Psychiatric: She has a normal mood and affect.  Nursing note and vitals reviewed.    UC Treatments / Results  Labs (all labs ordered are listed, but only abnormal results are displayed) Labs Reviewed - No data to display  EKG  EKG Interpretation None       Radiology No results found.  Procedures Procedures (including critical care time)  Medications Ordered in UC Medications - No data to display   Initial Impression / Assessment and Plan / UC Course  I have reviewed the triage vital signs and the nursing notes.  Pertinent labs & imaging results that were available during my care of the patient were reviewed by me and considered in my medical decision making (see chart for details).  Final Clinical Impressions(s) / UC Diagnoses   Final diagnoses:  Upper respiratory tract  infection, unspecified type   The patient's signs and symptoms are most consistent with a diagnosis of URI. Patient educated that antibiotic would not be helpful since this is a viral illness. Treatment is supportive. Patient  encouraged to rest, drink plenty of fluid, use Motrin/Tylenol as needed at appropriate dose for pain/fever. May do Honey, throat lozenges, or chloraseptic spray for sore throat. Please follow up with PCP or return for no improvement.   New Prescriptions New Prescriptions   No medications on file     Controlled Substance Prescriptions [place] Controlled Substance Registry consulted? Not Applicable   Barry Dienes, NP 06/06/17 1235

## 2017-08-30 ENCOUNTER — Ambulatory Visit (HOSPITAL_COMMUNITY)
Admission: EM | Admit: 2017-08-30 | Discharge: 2017-08-30 | Disposition: A | Discharge status: Home / Self Care | Payer: BLUE CROSS/BLUE SHIELD | Attending: Family Medicine | Admitting: Family Medicine

## 2017-08-30 ENCOUNTER — Encounter (HOSPITAL_COMMUNITY): Payer: Self-pay | Admitting: Emergency Medicine

## 2017-08-30 DIAGNOSIS — E559 Vitamin D deficiency, unspecified: Secondary | ICD-10-CM | POA: Insufficient documentation

## 2017-08-30 DIAGNOSIS — N840 Polyp of corpus uteri: Secondary | ICD-10-CM | POA: Diagnosis not present

## 2017-08-30 DIAGNOSIS — Z87442 Personal history of urinary calculi: Secondary | ICD-10-CM | POA: Diagnosis not present

## 2017-08-30 DIAGNOSIS — Z79899 Other long term (current) drug therapy: Secondary | ICD-10-CM | POA: Diagnosis not present

## 2017-08-30 DIAGNOSIS — N760 Acute vaginitis: Secondary | ICD-10-CM

## 2017-08-30 DIAGNOSIS — R300 Dysuria: Secondary | ICD-10-CM | POA: Diagnosis present

## 2017-08-30 DIAGNOSIS — E669 Obesity, unspecified: Secondary | ICD-10-CM | POA: Diagnosis not present

## 2017-08-30 DIAGNOSIS — Z87440 Personal history of urinary (tract) infections: Secondary | ICD-10-CM | POA: Diagnosis not present

## 2017-08-30 DIAGNOSIS — E119 Type 2 diabetes mellitus without complications: Secondary | ICD-10-CM | POA: Insufficient documentation

## 2017-08-30 DIAGNOSIS — N898 Other specified noninflammatory disorders of vagina: Secondary | ICD-10-CM | POA: Diagnosis present

## 2017-08-30 DIAGNOSIS — Z7984 Long term (current) use of oral hypoglycemic drugs: Secondary | ICD-10-CM | POA: Insufficient documentation

## 2017-08-30 DIAGNOSIS — E7800 Pure hypercholesterolemia, unspecified: Secondary | ICD-10-CM | POA: Insufficient documentation

## 2017-08-30 DIAGNOSIS — N390 Urinary tract infection, site not specified: Secondary | ICD-10-CM | POA: Diagnosis present

## 2017-08-30 LAB — POCT URINALYSIS DIP (DEVICE)
BILIRUBIN URINE: NEGATIVE
Glucose, UA: NEGATIVE mg/dL
Hgb urine dipstick: NEGATIVE
Ketones, ur: NEGATIVE mg/dL
Leukocytes, UA: NEGATIVE
NITRITE: NEGATIVE
PH: 7 (ref 5.0–8.0)
Protein, ur: NEGATIVE mg/dL
Specific Gravity, Urine: 1.02 (ref 1.005–1.030)
Urobilinogen, UA: 1 mg/dL (ref 0.0–1.0)

## 2017-08-30 LAB — POCT PREGNANCY, URINE: PREG TEST UR: NEGATIVE

## 2017-08-30 NOTE — ED Triage Notes (Signed)
Pt sts vaginal irritation and discharge; pt sts some dysuria

## 2017-08-30 NOTE — ED Provider Notes (Signed)
[place]    CSN: [id number] Arrival date & time: 08/30/17  1222     History   Chief Complaint Chief Complaint  Patient presents with  . Vaginal Discharge  . Urinary Tract Infection    HPI Stacey Byrd is a 36 y.o. female.   HPI  Stacey Byrd is a 36 y.o. female presenting to UC with c/o vaginal irritation and discharge with some dysuria that started about 2 days ago.  Denies urinary frequency or hematuria. Denies abdominal pain, back pain, n/v/d. No fever or chills. Denies concern for STIs but has had BV and vaginal yeast infections in the past. No recent antibiotic use.   Past Medical History:  Diagnosis Date  . Abnormal uterine bleeding 04/03/2016  . Diabetes mellitus without complication ([place])   . Eczema    sees dr Allyson Sabal  . Head ache   . Infection    UTI  . Kidney stone 2013   [facility]  . 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 Obesity, unspecified     Patient Active Problem List   Diagnosis Date Noted  . Endometrial polyp 09/29/2016  . Unspecified vitamin D deficiency 02/17/2013  . Pure hypercholesterolemia 02/17/2013    Past Surgical History:  Procedure Laterality Date  . BREAST REDUCTION SURGERY  07/2010  . BREAST SURGERY      OB History    Gravida Para Term Preterm AB Living   0 0 0 0 0 0   SAB TAB Ectopic Multiple Live Births   0 0 0 0         Home Medications    Prior to Admission medications   Medication Sig Start Date End Date Taking? Authorizing Provider  benzonatate (TESSALON) 100 MG capsule Take 2 capsules (200 mg total) by mouth 3 (three) times daily as needed for cough. 11/16/16   Lysbeth Penner, FNP  ibuprofen (ADVIL,MOTRIN) 200 MG tablet Take 600 mg by mouth every 6 (six) hours as needed for mild pain or moderate pain.     [provider]  ipratropium (ATROVENT) 0.06 % nasal spray Place 2 sprays into both nostrils 4 (four) times daily. 11/16/16   Lysbeth Penner, FNP  megestrol  (MEGACE) 20 MG tablet Take 1 tablet (20 mg total) by mouth daily. 09/29/16   Aletha Halim, MD  metFORMIN (GLUCOPHAGE) 500 MG tablet Take 1 tablet (500 mg total) by mouth 2 (two) times daily with a meal. 08/09/16   Kirichenko, Tatyana, PA-C  triamcinolone (KENALOG) 0.025 % cream Apply 1 application topically 2 (two) times daily.    [provider]    Family History Family History  Problem Relation Age of Onset  . Diabetes Mother   . Hypertension Mother   . Gout Mother   . Crohn's disease Mother   . Osteoporosis Mother   . Kidney disease Mother        mild, related to DM  . Hyperlipidemia Mother   . Stroke Father 40  . Hypertension Father   . Seizures Father        related to stroke  . Hyperlipidemia Father   . Kidney Stones Brother   . Hyperlipidemia Maternal Grandmother   . Cancer Neg Hx   . Heart disease Neg Hx     Social History Social History   Tobacco Use  . Smoking status: Never Smoker  . Smokeless tobacco: Never Used  . Tobacco comment: quit in early 20's  Substance Use Topics  .  Alcohol use: Yes    Comment: occasional-monthly  . Drug use: No     Allergies   Patient has no known allergies.   Review of Systems Review of Systems  Constitutional: Negative for chills and fever.  Gastrointestinal: Negative for abdominal pain, diarrhea, nausea and vomiting.  Genitourinary: Positive for dysuria and vaginal discharge. Negative for decreased urine volume, flank pain, frequency, hematuria, urgency, vaginal bleeding and vaginal pain.  Musculoskeletal: Negative for back pain and myalgias.     Physical Exam Triage Vital Signs ED Triage Vitals [08/30/17 1300]  Enc Vitals Group     BP (!) 148/84     Pulse Rate 92     Resp 18     Temp 98.3 F (36.8 C)     Temp Source Oral     SpO2 100 %     Weight      Height      Head Circumference      Peak Flow      Pain Score      Pain Loc      Pain Edu?      Excl. in [place]?    No data found.  Updated  Vital Signs BP (!) 148/84 (BP Location: Right Arm)   Pulse 92   Temp 98.3 F (36.8 C) (Oral)   Resp 18   SpO2 100%   Visual Acuity Right Eye Distance:   Left Eye Distance:   Bilateral Distance:    Right Eye Near:   Left Eye Near:    Bilateral Near:     Physical Exam  Constitutional: She is oriented to person, place, and time. She appears well-developed and well-nourished. No distress.  HENT:  Head: Normocephalic and atraumatic.  Eyes: EOM are normal.  Neck: Normal range of motion.  Cardiovascular: Normal rate and regular rhythm.  Pulmonary/Chest: Effort normal and breath sounds normal. No stridor. No respiratory distress. She has no wheezes. She has no rales.  Genitourinary:  Genitourinary Comments: Chaperoned exam: normal external genitalia. Vaginal canal: small amount of thin white-clear malodorous discharge. No bleeding. No CMT, adnexal tenderness or masses.   Musculoskeletal: Normal range of motion.  Neurological: She is alert and oriented to person, place, and time.  Skin: Skin is warm and dry. She is not diaphoretic.  Psychiatric: She has a normal mood and affect. Her behavior is normal.  Nursing note and vitals reviewed.    UC Treatments / Results  Labs (all labs ordered are listed, but only abnormal results are displayed) Labs Reviewed  POCT URINALYSIS DIP (DEVICE)  POCT PREGNANCY, URINE  CERVICOVAGINAL ANCILLARY ONLY    EKG  EKG Interpretation None       Radiology No results found.  Procedures Procedures (including critical care time)  Medications Ordered in UC Medications - No data to display   Initial Impression / Assessment and Plan / UC Course  I have reviewed the triage vital signs and the nursing notes.  Pertinent labs & imaging results that were available during my care of the patient were reviewed by me and considered in my medical decision making (see chart for details).     UA: unremarkable Swab sent to lab for BV and yeast Pt  will be notified if results are positive F/u with PCP or GYN if not improving in 1 week.   Final Clinical Impressions(s) / UC Diagnoses   Final diagnoses:  Vaginal discharge  Acute vaginitis    ED Discharge Orders    None  Controlled Substance Prescriptions [place] Controlled Substance Registry consulted? Not Applicable   Tyrell Antonio 08/30/17 1531

## 2017-08-30 NOTE — Discharge Instructions (Addendum)
°  You will be notified of your results within 2-3 days. If the tests are normal, you may not receive a call but you can check your results on your MyChart online patient portal. If your tests come back positive, you will be notified and the appropriate medication(s) will be called into your preferred pharmacy.

## 2017-08-31 LAB — CERVICOVAGINAL ANCILLARY ONLY
Bacterial vaginitis: POSITIVE — AB
Candida vaginitis: NEGATIVE

## 2017-09-01 ENCOUNTER — Telehealth (HOSPITAL_COMMUNITY): Payer: Self-pay | Admitting: Internal Medicine

## 2017-09-01 MED ORDER — METRONIDAZOLE 500 MG PO TABS: 500.0000 mg | ORAL_TABLET | Freq: Two times a day (BID) | ORAL | 0 refills | Status: DC

## 2017-09-01 NOTE — Telephone Encounter (Signed)
Please let patient know that test for gardnerella (bacterial vaginosis) was positive.  Rx for metronidazole was sent to the pharmacy of record, [facility] on [address] at [employer].  Followup with [facility], [phone] as discussed at urgent care visit for further evaluation if symptoms are not improving.  LM

## 2017-10-30 ENCOUNTER — Telehealth: Payer: Self-pay | Admitting: Obstetrics and Gynecology

## 2017-10-30 NOTE — Telephone Encounter (Signed)
Questions about getting a new Rx.

## 2017-11-11 MED ORDER — MEGESTROL ACETATE 20 MG PO TABS: 20.0000 mg | ORAL_TABLET | Freq: Every day | ORAL | 0 refills | Status: DC

## 2017-11-11 NOTE — Telephone Encounter (Signed)
Called patient & she states she needs a refill on her megace. Told patient we haven't seen her in over a year so she will likely need to come back for a follow up appt. Discussed I will reach out to Dr Ilda Basset that she is requesting a refill & to check her mychart account for a response. Patient verbalized understanding to all & had no questions

## 2017-11-11 NOTE — Addendum Note (Signed)
Addended by: Aletha Halim on: 11/11/2017 02:58 PM   Modules accepted: Orders

## 2017-11-11 NOTE — Telephone Encounter (Signed)
Can you let her know I sent in a refill to last her for a bit but she needs to make an annual exam appointment to get anymore? thanks

## 2017-11-12 ENCOUNTER — Other Ambulatory Visit: Payer: Self-pay | Admitting: Obstetrics and Gynecology

## 2017-11-12 MED ORDER — MEGESTROL ACETATE 20 MG PO TABS: 20.0000 mg | ORAL_TABLET | Freq: Every day | ORAL | 0 refills | Status: DC

## 2017-11-30 ENCOUNTER — Ambulatory Visit: Payer: BLUE CROSS/BLUE SHIELD | Admitting: Obstetrics and Gynecology

## 2017-11-30 ENCOUNTER — Encounter: Payer: Self-pay | Admitting: Obstetrics and Gynecology

## 2017-12-01 NOTE — Progress Notes (Signed)
Patient did not keep GYN appointment for 11/30/2017.  Durene Romans MD Attending Center for [employer])

## 2018-01-13 ENCOUNTER — Ambulatory Visit (HOSPITAL_COMMUNITY)
Admission: EM | Admit: 2018-01-13 | Discharge: 2018-01-13 | Disposition: A | Discharge status: Home / Self Care | Payer: Self-pay | Attending: Family Medicine | Admitting: Family Medicine

## 2018-01-13 ENCOUNTER — Encounter (HOSPITAL_COMMUNITY): Payer: Self-pay | Admitting: Emergency Medicine

## 2018-01-13 DIAGNOSIS — L309 Dermatitis, unspecified: Secondary | ICD-10-CM

## 2018-01-13 DIAGNOSIS — J302 Other seasonal allergic rhinitis: Secondary | ICD-10-CM

## 2018-01-13 DIAGNOSIS — R030 Elevated blood-pressure reading, without diagnosis of hypertension: Secondary | ICD-10-CM

## 2018-01-13 MED ORDER — TRIAMCINOLONE ACETONIDE 0.025 % EX CREA: 1.0000 "application " | TOPICAL_CREAM | Freq: Two times a day (BID) | CUTANEOUS | 3 refills | Status: DC

## 2018-01-13 NOTE — Discharge Instructions (Signed)
Your blood pressure was noted to be elevated during your visit today. You may return here within the next few days or when you are well to recheck if unable to see your primary care doctor.

## 2018-01-13 NOTE — ED Triage Notes (Signed)
Pt sts rash to face and increased eczema sx; pt sts redness to left eye that is now resolving

## 2018-01-21 NOTE — ED Provider Notes (Signed)
Kenneth   [id number] 01/13/18 Arrival Time: 3790  ASSESSMENT & PLAN:  1. Eczema, unspecified type   2. Blood pressure elevated without history of HTN   3. Seasonal allergic rhinitis, unspecified trigger     Meds ordered this encounter  Medications  . triamcinolone (KENALOG) 0.025 % cream    Sig: Apply 1 application topically 2 (two) times daily.    Dispense:  30 g    Refill:  3   Monitor BP. OTC allergy meds. Will follow up with PCP or here if worsening or failing to improve as anticipated and to recheck BP. Reviewed expectations re: course of current medical issues. Questions answered. Outlined signs and symptoms indicating need for more acute intervention. Patient verbalized understanding. After Visit Summary given.   SUBJECTIVE:  Gunhild Bautch Bueso is a 37 y.o. female who presents with a skin complaint.   Location: facial  Gradual onset. For many years with occasional exacerbations. Pruritic? Yes Painful? No Progression: stable  Drainage? No  Known trigger? No  New soaps/lotions/topicals/detergents? No Environmental exposures or allergies? none Contacts with similar? No Recent travel? No  Other associated symptoms: none Therapies tried thus far: OTC cream without relief Denies fever. No specific aggravating or alleviating factors reported.  Also notice increased BP. She questions being told high in the past.  Asymptomatic.  ROS: As per HPI.  OBJECTIVE: Vitals:   01/13/18 1804  BP: (!) 183/101  Pulse: (!) 110  Resp: 18  Temp: 98.3 F (36.8 C)  TempSrc: Oral  SpO2: 100%    General appearance: alert; no distress Lungs: clear to auscultation bilaterally Heart:tachycardia noted; regular rhythm Extremities: no edema Skin: warm and dry; eczema on face; cheeks mainly; no infection Psychological: alert and cooperative; normal mood and affect  No Known Allergies  Past Medical History:  Diagnosis Date  . Abnormal uterine bleeding 04/03/2016    . Diabetes mellitus without complication ([place])   . Eczema    sees dr Allyson Sabal  . Head ache   . Infection    UTI  . Kidney stone 2013   [facility]  . 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 Obesity, unspecified    Social History   Socioeconomic History  . Marital status: Single    Spouse name: Not on file  . Number of children: Not on file  . Years of education: Not on file  . Highest education level: Not on file  Occupational History  . 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 Financial resource strain: Not on file  . Food insecurity:    Worry: Not on file    Inability: Not on file  . Transportation needs:    Medical: Not on file    Non-medical: Not on file  Tobacco Use  . Smoking status: Never Smoker  . Smokeless tobacco: Never Used  . Tobacco comment: quit in early 20's  Substance and Sexual Activity  . Alcohol use: Yes    Comment: occasional-monthly  . Drug use: No  . Sexual activity: Not on file  Lifestyle  . Physical activity:    Days per week: Not on file    Minutes per session: Not on file  . Stress: Not on file  Relationships  . Social connections:    Talks on phone: Not on file    Gets together: Not on file    Attends religious service: Not on file  Active member of club or organization: Not on file    Attends meetings of clubs or organizations: Not on file    Relationship status: Not on file  . Intimate partner violence:    Fear of current or ex partner: Not on file    Emotionally abused: Not on file    Physically abused: Not on file    Forced sexual activity: Not on file  Other Topics Concern  . Not on file  Social History Narrative   Single, lives alone, no pets   Family History  Problem Relation Age of Onset  . Diabetes Mother   . Hypertension Mother   . Gout Mother   . Crohn's disease Mother   . Osteoporosis Mother   . Kidney disease Mother         mild, related to DM  . Hyperlipidemia Mother   . Stroke Father 32  . Hypertension Father   . Seizures Father        related to stroke  . Hyperlipidemia Father   . Kidney Stones Brother   . Hyperlipidemia Maternal Grandmother   . Cancer Neg Hx   . Heart disease Neg Hx    Past Surgical History:  Procedure Laterality Date  . BREAST REDUCTION SURGERY  07/2010  . BREAST SURGERY       Vanessa Kick, MD 01/21/18 [phone]

## 2018-06-11 ENCOUNTER — Telehealth: Payer: Self-pay | Admitting: General Practice

## 2018-06-11 DIAGNOSIS — D219 Benign neoplasm of connective and other soft tissue, unspecified: Secondary | ICD-10-CM

## 2018-06-11 MED ORDER — MEGESTROL ACETATE 20 MG PO TABS: 20.0000 mg | ORAL_TABLET | Freq: Every day | ORAL | 0 refills | Status: DC

## 2018-06-11 NOTE — Telephone Encounter (Signed)
Patient called & left message on nurse voicemail line requesting a refill on Megace. Patient states she has been contacting her pharmacy for the past month requesting a refill.  Per chart review, patient has not been seen since 2017 & has missed follow up appts. Per Dr Ilda Basset, patient can have 1 refill but no more after until patient comes in for an appt.  Called patient, no answer- left message on voicemail stating we have sent a refill to your pharmacy. Please contact our office for an appt for follow up before additional refills can be given. Will send mychart message.

## 2018-07-07 ENCOUNTER — Ambulatory Visit: Payer: Self-pay | Admitting: Obstetrics and Gynecology

## 2018-07-26 ENCOUNTER — Encounter: Payer: Self-pay | Admitting: Obstetrics and Gynecology

## 2018-07-26 ENCOUNTER — Ambulatory Visit (INDEPENDENT_AMBULATORY_CARE_PROVIDER_SITE_OTHER): Payer: PRIVATE HEALTH INSURANCE | Admitting: Obstetrics and Gynecology

## 2018-07-26 VITALS — BP 129/78 | HR 93 | Ht 65.0 in | Wt 224.0 lb

## 2018-07-26 DIAGNOSIS — Z1151 Encounter for screening for human papillomavirus (HPV): Secondary | ICD-10-CM | POA: Diagnosis not present

## 2018-07-26 DIAGNOSIS — Z01419 Encounter for gynecological examination (general) (routine) without abnormal findings: Secondary | ICD-10-CM | POA: Diagnosis not present

## 2018-07-26 DIAGNOSIS — Z124 Encounter for screening for malignant neoplasm of cervix: Secondary | ICD-10-CM

## 2018-07-26 DIAGNOSIS — D219 Benign neoplasm of connective and other soft tissue, unspecified: Secondary | ICD-10-CM | POA: Diagnosis not present

## 2018-07-26 MED ORDER — NORETHINDRONE 0.35 MG PO TABS: 1.0000 | ORAL_TABLET | Freq: Every day | ORAL | 3 refills | Status: DC

## 2018-07-26 MED ORDER — MEGESTROL ACETATE 40 MG PO TABS: ORAL_TABLET | ORAL | 0 refills | Status: DC

## 2018-07-26 NOTE — Progress Notes (Signed)
error 

## 2018-07-26 NOTE — Progress Notes (Signed)
Obstetrics and Gynecology Annual Patient Evaluation  Appointment Date: 07/26/2018  OBGYN Clinic: [facility] for [facility]  Primary Care Provider: Georga Byrd Byrd  Chief Complaint: follow up AUB  History of Present Illness: Stacey Byrd is a 37 y.o. African-American G0P0000 (Patient's last menstrual period was 06/03/2018 (within weeks).), seen for the above chief complaint.   Patient last seen in 2017 for AUB with embx negative but did show polyp. Recommended to patient to do hysteroscopy, d&c but she responded to megace and desired to continue that.  Patient states she takes megace 20mg  q3d and that she rarely has a period with last one about 37m ago but did state it lasted about 2 weeks.    No breast s/s, fevers, chills, , abdominal pain, dysuria, hematuria, vaginal itching, diarrhea, constipation, blood in BMs  Review of Systems: as noted in the History of Present Illness.  Past Medical History:  Past Medical History:  Diagnosis Date  . Abnormal uterine bleeding 04/03/2016  . Diabetes mellitus without complication ([place])   . Eczema    sees dr Allyson Byrd  . Head ache   . Infection    UTI  . Kidney stone 2013   [facility]  . Normal pressure hydrocephalus ([place]) 08/2011   [facility]  . Obesity, unspecified     Past Surgical History:  Past Surgical History:  Procedure Laterality Date  . BREAST REDUCTION SURGERY  07/2010  . BREAST SURGERY      Past Obstetrical History:  OB History  Gravida Para Term Preterm AB Living  0 0 0 0 0 0  SAB TAB Ectopic Multiple Live Births  0 0 0 0      Past Gynecological History: As per HPI. History of Pap Smear(s): Yes.   Last pap 2014, which was NILM/HPV neg History of STI(s): No. She is currently using nothing for contraception.   Social History:  Social History   Socioeconomic History  . Marital status: Single    Spouse name: Not on file  . Number of children: Not on file  . Years of education: Not on file  .  Highest education level: Not on file  Occupational History  . 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 Financial resource strain: Not on file  . Food insecurity:    Worry: Not on file    Inability: Not on file  . Transportation needs:    Medical: Not on file    Non-medical: Not on file  Tobacco Use  . Smoking status: Never Smoker  . Smokeless tobacco: Never Used  . Tobacco comment: quit in early 20's  Substance and Sexual Activity  . Alcohol use: Yes    Comment: occasional-monthly  . Drug use: No  . Sexual activity: Not on file  Lifestyle  . Physical activity:    Days per week: Not on file    Minutes per session: Not on file  . Stress: Not on file  Relationships  . Social connections:    Talks on phone: Not on file    Gets together: Not on file    Attends religious service: Not on file    Active member of club or organization: Not on file    Attends meetings of clubs or organizations: Not on file    Relationship status: Not on file  . Intimate partner violence:    Fear of current or ex partner: Not on file  Emotionally abused: Not on file    Physically abused: Not on file    Forced sexual activity: Not on file  Other Topics Concern  . Not on file  Social History Narrative   Single, lives alone, no pets    Family History:  Family History  Problem Relation Age of Onset  . Diabetes Mother   . Hypertension Mother   . Gout Mother   . Crohn's disease Mother   . Osteoporosis Mother   . Kidney disease Mother        mild, related to DM  . Hyperlipidemia Mother   . Stroke Father 43  . Hypertension Father   . Seizures Father        related to stroke  . Hyperlipidemia Father   . Kidney Stones Brother   . Hyperlipidemia Maternal Grandmother   . Cancer Neg Hx   . Heart disease Neg Hx    She denies any female cancers  Medications Stacey Byrd had no medications administered during this  visit. Current Outpatient Medications  Medication Sig Dispense Refill  . atorvastatin (LIPITOR) 80 MG tablet Take 80 mg by mouth daily.    . megestrol (MEGACE) 20 MG tablet  30 tablet 0  . metFORMIN (GLUCOPHAGE) 500 MG tablet Take 1 tablet (500 mg total) by mouth 2 (two) times daily with a meal. 60 tablet 1  . sitaGLIPtin (JANUVIA) 100 MG tablet Take 100 mg by mouth daily.    Marland Kitchen triamcinolone (KENALOG) 0.025 % cream Apply 1 application topically 2 (two) times daily. 30 g 3   No current facility-administered medications for this visit.     Allergies Patient has no known allergies.   Physical Exam:  BP 129/78   Pulse 93   Ht 5\' 5"  (1.651 m)   Wt 224 lb (101.6 kg)   LMP 06/03/2018 (Within Weeks)   BMI 37.28 kg/m  Body mass index is 37.28 kg/m. General appearance: Well nourished, well developed female in no acute distress.  Neck:  Supple, normal appearance, and no thyromegaly  Cardiovascular: normal s1 and s2.  No murmurs, rubs or gallops. Respiratory:  Clear to auscultation bilateral. Normal respiratory effort Abdomen: positive bowel sounds and no masses, hernias; diffusely non tender to palpation, non distended Neuro/Psych:  Normal mood and affect.  Skin:  Warm and dry.  Lymphatic:  No inguinal lymphadenopathy.   Pelvic exam: is limited by body habitus EGBUS: within normal limits, Vagina: within normal limits, no blood. Slightly brown d/c in vault. Cervix: normal appearing cervix without tenderness, discharge or lesions. Uterus:  nonenlarged and non tender and Adnexa:  normal adnexa and no mass, fullness, tenderness Rectovaginal: deferred  Laboratory: none  Radiology: none  Assessment: pt doing well  Plan:  1. Cervical cancer screening Pap today - Cytology - PAP  2. Encounter for gynecological examination Pt declines any STI testing today.   3. History of AUB I told her I recommend switching over to camila qday as can also be used for contraception too and can use  megace for any PRN heavy menstrual bleeding. I told her most cycles are suppressed with camila and if any issues or problems to let me know via mychart.  RTC 1 year  Aletha Halim, Brooke Bonito MD Attending Center for [facility] [employer] farm manager)

## 2018-07-28 LAB — CYTOLOGY - PAP
DIAGNOSIS: NEGATIVE
HPV (WINDOPATH): NOT DETECTED

## 2019-04-26 ENCOUNTER — Telehealth: Payer: Self-pay | Admitting: General Practice

## 2019-04-26 DIAGNOSIS — N938 Other specified abnormal uterine and vaginal bleeding: Secondary | ICD-10-CM

## 2019-04-26 DIAGNOSIS — Z3041 Encounter for surveillance of contraceptive pills: Secondary | ICD-10-CM

## 2019-04-26 MED ORDER — NORETHINDRONE 0.35 MG PO TABS: 1.0000 | ORAL_TABLET | Freq: Every day | ORAL | 1 refills | Status: DC

## 2019-04-26 NOTE — Telephone Encounter (Signed)
Patient called and left message on nurse voicemail line stating she is a patient of Dr Ilda Basset and needs her prescription sent to [facility].   Rx reordered. Called patient, no answer- left message stating we received her voicemail message and have sent her prescription to [facility], she may call us back if she has other questions/concerns.

## 2019-07-08 DIAGNOSIS — E119 Type 2 diabetes mellitus without complications: Secondary | ICD-10-CM | POA: Insufficient documentation

## 2019-07-08 DIAGNOSIS — E782 Mixed hyperlipidemia: Secondary | ICD-10-CM | POA: Insufficient documentation

## 2019-07-26 ENCOUNTER — Telehealth: Payer: Self-pay | Admitting: Family Medicine

## 2019-07-26 NOTE — Telephone Encounter (Signed)
The patient stated she will run out of birth control pills before the appointment. There are no refills left on her prescription. Can we please re-issue enough or another refill until she can make her visit.

## 2019-07-27 ENCOUNTER — Telehealth (INDEPENDENT_AMBULATORY_CARE_PROVIDER_SITE_OTHER): Payer: PRIVATE HEALTH INSURANCE

## 2019-07-27 DIAGNOSIS — N938 Other specified abnormal uterine and vaginal bleeding: Secondary | ICD-10-CM

## 2019-07-27 DIAGNOSIS — Z3041 Encounter for surveillance of contraceptive pills: Secondary | ICD-10-CM

## 2019-07-27 MED ORDER — NORETHINDRONE 0.35 MG PO TABS: 1.0000 | ORAL_TABLET | Freq: Every day | ORAL | 0 refills | Status: DC

## 2019-07-27 NOTE — Telephone Encounter (Signed)
Called pt. Pt states she does not have enough birth control to last until appt with provider next month. Notified pt we will refill birth control prescription. [place] [facility] by phone and gave verbal order.

## 2019-09-08 ENCOUNTER — Telehealth: Payer: Self-pay | Admitting: Obstetrics and Gynecology

## 2019-09-08 NOTE — Telephone Encounter (Signed)
Attempted to call patient about her appointment on 11/20 @ 8:55. No answer left voicemail instructing patient to wear a face mask for the entire appointment and no visitors are allowed during the visit. Patient instructed not to attend the appointment if she was any symptoms. Symptom list and office number left.

## 2019-09-09 ENCOUNTER — Encounter: Payer: Self-pay | Admitting: Obstetrics and Gynecology

## 2019-09-09 ENCOUNTER — Ambulatory Visit (INDEPENDENT_AMBULATORY_CARE_PROVIDER_SITE_OTHER): Payer: PRIVATE HEALTH INSURANCE | Admitting: Obstetrics and Gynecology

## 2019-09-09 ENCOUNTER — Other Ambulatory Visit: Payer: Self-pay

## 2019-09-09 VITALS — BP 124/85 | HR 101 | Wt 232.7 lb

## 2019-09-09 DIAGNOSIS — N938 Other specified abnormal uterine and vaginal bleeding: Secondary | ICD-10-CM | POA: Diagnosis not present

## 2019-09-09 DIAGNOSIS — Z3041 Encounter for surveillance of contraceptive pills: Secondary | ICD-10-CM

## 2019-09-09 DIAGNOSIS — B373 Candidiasis of vulva and vagina: Secondary | ICD-10-CM

## 2019-09-09 DIAGNOSIS — B3731 Acute candidiasis of vulva and vagina: Secondary | ICD-10-CM

## 2019-09-09 MED ORDER — FLUCONAZOLE 150 MG PO TABS: 150.0000 mg | ORAL_TABLET | Freq: Once | ORAL | 0 refills | Status: AC

## 2019-09-09 MED ORDER — NORETHINDRONE 0.35 MG PO TABS: 1.0000 | ORAL_TABLET | Freq: Every day | ORAL | 4 refills | Status: DC

## 2019-09-09 NOTE — Progress Notes (Signed)
Pap 2019 Mammogram in 2011 after breast reduction surgery. Bleeding is better and going back to normal

## 2019-09-09 NOTE — Progress Notes (Signed)
Obstetrics and Gynecology Annual Patient Evaluation  Appointment Date: 09/09/2019  OBGYN Clinic: [facility] for [facility]  Primary Care Provider: Patient, No Pcp Per  Referring Provider: No ref. provider found  Chief Complaint:  Chief Complaint  Patient presents with  . Gynecologic Exam    History of Present Illness: Stacey Byrd is a 38 y.o. African-American G0P0000 (No LMP recorded.), seen for the above chief complaint.   No breast s/s, abdominal pain, vaginal itching, dyspareunia  Review of Systems: as noted in the History of Present Illness.  Patient Active Problem List   Diagnosis Date Noted  . Endometrial polyp 09/29/2016  . Unspecified vitamin D deficiency 02/17/2013  . Pure hypercholesterolemia 02/17/2013    Past Medical History:  Past Medical History:  Diagnosis Date  . Abnormal uterine bleeding 04/03/2016  . DM (diabetes mellitus), type 2 ([place])   . Eczema    sees dr Allyson Sabal  . Head ache   . Infection    UTI  . Kidney stone 2013   [facility]  . Normal pressure hydrocephalus ([place]) 08/2011   [facility]  . Obesity, unspecified     Past Surgical History:  Past Surgical History:  Procedure Laterality Date  . BREAST REDUCTION SURGERY  07/2010    Past Obstetrical History:  OB History  Gravida Para Term Preterm AB Living  0 0 0 0 0 0  SAB TAB Ectopic Multiple Live Births  0 0 0 0      Past Gynecological History: As per HPI. Periods: not qmonth and when does have a period ( not heavy or painful) History of Pap Smear(s): Yes.   Last pap 2019, which was negative History of STI(s): No. She is currently using POPs for contraception.   Social History:  Social History   Socioeconomic History  . Marital status: Single    Spouse name: Not on file  . Number of children: Not on file  . Years of education: Not on file  . Highest education level: Not on file  Occupational History  . 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 Financial resource strain: Not on file  . Food insecurity    Worry: Not on file    Inability: Not on file  . Transportation needs    Medical: Not on file    Non-medical: Not on file  Tobacco Use  . Smoking status: Never Smoker  . Smokeless tobacco: Never Used  . Tobacco comment: quit in early 20's  Substance and Sexual Activity  . Alcohol use: Yes    Comment: occasional-monthly  . Drug use: No  . Sexual activity: Not on file  Lifestyle  . Physical activity    Days per week: Not on file    Minutes per session: Not on file  . Stress: Not on file  Relationships  . Social Herbalist on phone: Not on file    Gets together: Not on file    Attends religious service: Not on file    Active member of club or organization: Not on file    Attends meetings of clubs or organizations: Not on file    Relationship status: Not on file  . Intimate partner violence    Fear of current or ex partner: Not on file    Emotionally abused: Not on file    Physically abused: Not on file    Forced sexual activity: Not  on file  Other Topics Concern  . Not on file  Social History Narrative   Single, lives alone, no pets    Family History:  Family History  Problem Relation Age of Onset  . Diabetes Mother   . Hypertension Mother   . Gout Mother   . Crohn's disease Mother   . Osteoporosis Mother   . Kidney disease Mother        mild, related to DM  . Hyperlipidemia Mother   . Stroke Father 66  . Hypertension Father   . Seizures Father        related to stroke  . Hyperlipidemia Father   . Kidney Stones Brother   . Hyperlipidemia Maternal Grandmother   . Cancer Neg Hx   . Heart disease Neg Hx    She denies any female cancers  Medications Stacey Byrd had no medications administered during this visit. Current Outpatient Medications  Medication Sig Dispense Refill  . atorvastatin (LIPITOR) 80 MG tablet Take 80 mg by  mouth daily.    . Lancets MISC Use to check blood glucose twice daily.    . norethindrone (CAMILA) 0.35 MG tablet Take 1 tablet (0.35 mg total) by mouth daily. Take at the same time daily 3 Package 4  . Cholecalciferol 125 MCG (5000 UT) capsule cholecalciferol (vitamin D3) 125 mcg (5,000 unit) capsule  Take 1 capsule every day by oral route as directed for 90 days.    . metFORMIN (GLUCOPHAGE) 500 MG tablet Take 1 tablet (500 mg total) by mouth 2 (two) times daily with a meal. (Patient not taking: Reported on 09/09/2019) 60 tablet 1  . sitaGLIPtin (JANUVIA) 100 MG tablet Take 100 mg by mouth daily.     No current facility-administered medications for this visit.     Allergies Patient has no known allergies.   Physical Exam:  BP 124/85   Pulse (!) 101   Wt 232 lb 11.2 oz (105.6 kg)   BMI 38.72 kg/m  Body mass index is 38.72 kg/m. General appearance: Well nourished, well developed female in no acute distress.  Neck:  Supple, normal appearance, and no thyromegaly  Cardiovascular: normal s1 and s2.  No murmurs, rubs or gallops. Respiratory:  Clear to auscultation bilateral. Normal respiratory effort Abdomen: positive bowel sounds and no masses, hernias; diffusely non tender to palpation, non distended Breasts: breasts appear normal, no suspicious masses, no skin or nipple changes or axillary nodes, and negative palpation. Neuro/Psych:  Normal mood and affect.  Skin:  Warm and dry.  Lymphatic:  No inguinal lymphadenopathy.   Pelvic exam: is not limited by body habitus EGBUS: within normal limits, Vagina: normal except for white cottage cheese like d/c in vault.  Cervix: normal appearing cervix without tenderness, discharge or lesions. Uterus:  nonenlarged and non tender and Adnexa:  normal adnexa and no mass, fullness, tenderness Rectovaginal: deferred  Laboratory: none  Radiology: none  Assessment: pt doing well  Plan:  1. History of AUB No issues. Refills sent in -  norethindrone (CAMILA) 0.35 MG tablet; Take 1 tablet (0.35 mg total) by mouth daily. Take at the same time daily  Dispense: 3 Package; Refill: 4  2. Encounter for surveillance of contraceptive pills - norethindrone (CAMILA) 0.35 MG tablet; Take 1 tablet (0.35 mg total) by mouth daily. Take at the same time daily  Dispense: 3 Package; Refill: 4  3. Vulvovaginal candidiasis Pt asymptomatic. Diflucan sent in and she states a1c is in the 5 range  4. GYN  care D/w her re: breast cancer screenings for her since she's low risk  RTC 1 year  Aletha Halim, Brooke Bonito MD Attending Center for [employer] Fish farm manager)

## 2020-02-28 ENCOUNTER — Ambulatory Visit
Admission: EM | Admit: 2020-02-28 | Discharge: 2020-02-28 | Disposition: A | Discharge status: Home / Self Care | Payer: Self-pay | Attending: Physician Assistant | Admitting: Physician Assistant

## 2020-02-28 DIAGNOSIS — M546 Pain in thoracic spine: Secondary | ICD-10-CM

## 2020-02-28 DIAGNOSIS — M542 Cervicalgia: Secondary | ICD-10-CM

## 2020-02-28 MED ORDER — KETOROLAC TROMETHAMINE 15 MG/ML IJ SOLN
15.0000 mg | Freq: Once | INTRAMUSCULAR | Status: AC
  Administered 2020-02-28: 15 mg via INTRAMUSCULAR

## 2020-02-28 MED ORDER — TIZANIDINE HCL 2 MG PO TABS: 2.0000 mg | ORAL_TABLET | Freq: Three times a day (TID) | ORAL | 0 refills | Status: DC | PRN

## 2020-02-28 MED ORDER — DICLOFENAC SODIUM 50 MG PO TBEC: 50.0000 mg | DELAYED_RELEASE_TABLET | Freq: Two times a day (BID) | ORAL | 0 refills | Status: DC

## 2020-02-28 NOTE — Discharge Instructions (Signed)
Toradol injection in office today. Start diclofenac, you can take the first dose 5-6 hours after toradol injection. Do not take ibuprofen (motrin/advil)/ naproxen (aleve) while on diclofenac. Tizanidine as needed, this can make you drowsy, so do not take if you are going to drive, operate heavy machinery, or make important decisions. Ice/heat compresses as needed. Follow up with PCP/orthopedics if symptoms worsen, changes for reevaluation. If experiencing loss of grip strength, go to the ED for further evaluation.

## 2020-02-28 NOTE — ED Triage Notes (Signed)
Pt c/o rt upper back/shoulder/neck pain since 6am. States feels a knot to upper back. States took ibuprofen 600mg  at 6am. Pt denies injury.

## 2020-02-28 NOTE — ED Provider Notes (Signed)
[name]    CSN: [id number] Arrival date & time: 02/28/20  0915      History   Chief Complaint Chief Complaint  Patient presents with  . Back Pain    HPI Stacey Byrd is a 39 y.o. female.   39 year old female comes in for right neck, shoulder, upper back pain since waking up this morning.  Feels like there is a knot to the upper back.  Denies injury/trauma.  Denies radiation of pain, numbness, tingling.  Denies loss of grip strength.  Took ibuprofen 600 mg this morning without much relief.     Past Medical History:  Diagnosis Date  . Abnormal uterine bleeding 04/03/2016  . DM (diabetes mellitus), type 2 ([place])   . Eczema    sees dr Allyson Sabal  . Head ache   . Infection    UTI  . Kidney stone 2013   [facility]  . Normal pressure hydrocephalus ([place]) 08/2011   [facility]  . Obesity, unspecified     Patient Active Problem List   Diagnosis Date Noted  . Endometrial polyp 09/29/2016  . Unspecified vitamin D deficiency 02/17/2013  . Pure hypercholesterolemia 02/17/2013    Past Surgical History:  Procedure Laterality Date  . BREAST REDUCTION SURGERY  07/2010    OB History    Gravida  0   Para  0   Term  0   Preterm  0   AB  0   Living  0     SAB  0   TAB  0   Ectopic  0   Multiple  0   Live Births               Home Medications    Prior to Admission medications   Medication Sig Start Date End Date Taking? Authorizing Provider  atorvastatin (LIPITOR) 80 MG tablet Take 80 mg by mouth daily.    [provider]  Cholecalciferol 125 MCG (5000 UT) capsule cholecalciferol (vitamin D3) 125 mcg (5,000 unit) capsule  Take 1 capsule every day by oral route as directed for 90 days.    [provider]  diclofenac (VOLTAREN) 50 MG EC tablet Take 1 tablet (50 mg total) by mouth 2 (two) times daily. 02/28/20   Tasia Catchings, Yaron Grasse V, PA-C  Lancets MISC Use to check blood glucose twice daily. 07/14/19   [provider]    metFORMIN (GLUCOPHAGE) 500 MG tablet Take 1 tablet (500 mg total) by mouth 2 (two) times daily with a meal. 08/09/16   Kirichenko, Tatyana, PA-C  norethindrone (CAMILA) 0.35 MG tablet Take 1 tablet (0.35 mg total) by mouth daily. Take at the same time daily 09/09/19   Aletha Halim, MD  tiZANidine (ZANAFLEX) 2 MG tablet Take 1 tablet (2 mg total) by mouth every 8 (eight) hours as needed for muscle spasms. 02/28/20   Ok Edwards, PA-C    Family History Family History  Problem Relation Age of Onset  . Diabetes Mother   . Hypertension Mother   . Gout Mother   . Crohn's disease Mother   . Osteoporosis Mother   . Kidney disease Mother        mild, related to DM  . Hyperlipidemia Mother   . Stroke Father 78  . Hypertension Father   . Seizures Father        related to stroke  . Hyperlipidemia Father   . Kidney Stones Brother   . Hyperlipidemia Maternal Grandmother   . Cancer  Neg Hx   . Heart disease Neg Hx     Social History Social History   Tobacco Use  . Smoking status: Never Smoker  . Smokeless tobacco: Never Used  . Tobacco comment: quit in early 20's  Substance Use Topics  . Alcohol use: Yes    Comment: occasional-monthly  . Drug use: No     Allergies   Patient has no known allergies.   Review of Systems Review of Systems  Reason unable to perform ROS: See HPI as above.     Physical Exam Triage Vital Signs ED Triage Vitals  Enc Vitals Group     BP 02/28/20 0955 134/83     Pulse Rate 02/28/20 0955 98     Resp 02/28/20 0955 18     Temp 02/28/20 0955 98.1 F (36.7 C)     Temp Source 02/28/20 0955 Oral     SpO2 02/28/20 0955 96 %     Weight --      Height --      Head Circumference --      Peak Flow --      Pain Score 02/28/20 0956 8     Pain Loc --      Pain Edu? --      Excl. in [place]? --    No data found.  Updated Vital Signs BP 134/83 (BP Location: Left Arm)   Pulse 98   Temp 98.1 F (36.7 C) (Oral)   Resp 18   LMP 02/06/2020   SpO2 96%    Physical Exam Constitutional:      General: She is not in acute distress.    Appearance: She is well-developed. She is not diaphoretic.  HENT:     Head: Normocephalic and atraumatic.  Eyes:     Conjunctiva/sclera: Conjunctivae normal.     Pupils: Pupils are equal, round, and reactive to light.  Cardiovascular:     Rate and Rhythm: Normal rate and regular rhythm.     Heart sounds: Normal heart sounds. No murmur. No friction rub. No gallop.   Pulmonary:     Effort: Pulmonary effort is normal. No accessory muscle usage or respiratory distress.     Breath sounds: Normal breath sounds. No stridor. No decreased breath sounds, wheezing, rhonchi or rales.  Musculoskeletal:     Comments: No tenderness on palpation of the spinous processes.  Diffuse tenderness to palpation of the right neck, thoracic back.  No bony tenderness to the shoulder.  Decreased range of motion of neck, shoulder, back due to pain.  Strength 5/5 BLE. Sensation intact. Radial pulse 2+  Skin:    General: Skin is warm and dry.  Neurological:     Mental Status: She is alert and oriented to person, place, and time.      UC Treatments / Results  Labs (all labs ordered are listed, but only abnormal results are displayed) Labs Reviewed - No data to display  EKG   Radiology No results found.  Procedures Procedures (including critical care time)  Medications Ordered in UC Medications  ketorolac (TORADOL) 15 MG/ML injection 15 mg (15 mg Intramuscular Given 02/28/20 1039)    Initial Impression / Assessment and Plan / UC Course  I have reviewed the triage vital signs and the nursing notes.  Pertinent labs & imaging results that were available during my care of the patient were reviewed by me and considered in my medical decision making (see chart for details).    Toradol injection in office  today.  NSAIDs, muscle relaxers for symptomatic management.  Expected course of healing discussed.  Return precautions  given.  Patient expresses understanding and agrees plan.  Final Clinical Impressions(s) / UC Diagnoses   Final diagnoses:  Neck pain on right side  Acute right-sided thoracic back pain   ED Prescriptions    Medication Sig Dispense Auth. Provider   diclofenac (VOLTAREN) 50 MG EC tablet Take 1 tablet (50 mg total) by mouth 2 (two) times daily. 20 tablet Lauralyn Shadowens V, PA-C   tiZANidine (ZANAFLEX) 2 MG tablet Take 1 tablet (2 mg total) by mouth every 8 (eight) hours as needed for muscle spasms. 15 tablet Ok Edwards, PA-C     PDMP not reviewed this encounter.   Ok Edwards, PA-C 02/28/20 1141

## 2020-03-07 ENCOUNTER — Telehealth: Payer: Self-pay | Admitting: General Practice

## 2020-03-07 NOTE — Telephone Encounter (Signed)
Received refill request fax from patient's pharmacy for diflucan. Called patient to discuss, no answer- left message to call us back to discuss refill request.

## 2020-05-06 ENCOUNTER — Encounter: Payer: Self-pay | Admitting: Emergency Medicine

## 2020-05-06 ENCOUNTER — Other Ambulatory Visit: Payer: Self-pay

## 2020-05-06 ENCOUNTER — Ambulatory Visit
Admission: EM | Admit: 2020-05-06 | Discharge: 2020-05-06 | Disposition: A | Discharge status: Home / Self Care | Payer: Self-pay

## 2020-05-06 DIAGNOSIS — L309 Dermatitis, unspecified: Secondary | ICD-10-CM

## 2020-05-06 LAB — POCT URINE PREGNANCY: Preg Test, Ur: NEGATIVE

## 2020-05-06 MED ORDER — CLOTRIMAZOLE 1 % EX CREA: TOPICAL_CREAM | CUTANEOUS | 0 refills | Status: DC

## 2020-05-06 MED ORDER — TRIAMCINOLONE ACETONIDE 0.1 % EX CREA: 1.0000 "application " | TOPICAL_CREAM | Freq: Two times a day (BID) | CUTANEOUS | 0 refills | Status: DC

## 2020-05-06 NOTE — ED Provider Notes (Signed)
[name]    CSN: [id number] Arrival date & time: 05/06/20  1300      History   Chief Complaint Chief Complaint  Patient presents with  . Vaginitis    HPI Stacey Byrd is a 39 y.o. female history of diabetes, kidney stones, KUB presenting for left-sided vaginal irritation.  States it feels raw.  Denies intercourse, discharge, pruritus.  Past Medical History:  Diagnosis Date  . Abnormal uterine bleeding 04/03/2016  . DM (diabetes mellitus), type 2 ([place])   . Eczema    sees dr Allyson Sabal  . Head ache   . Infection    UTI  . Kidney stone 2013   [facility]  . Normal pressure hydrocephalus ([place]) 08/2011   [facility]  . Obesity, unspecified     Patient Active Problem List   Diagnosis Date Noted  . Endometrial polyp 09/29/2016  . Unspecified vitamin D deficiency 02/17/2013  . Pure hypercholesterolemia 02/17/2013    Past Surgical History:  Procedure Laterality Date  . BREAST REDUCTION SURGERY  07/2010    OB History    Gravida  0   Para  0   Term  0   Preterm  0   AB  0   Living  0     SAB  0   TAB  0   Ectopic  0   Multiple  0   Live Births               Home Medications    Prior to Admission medications   Medication Sig Start Date End Date Taking? Authorizing Provider  Cholecalciferol 125 MCG (5000 UT) capsule cholecalciferol (vitamin D3) 125 mcg (5,000 unit) capsule  Take 1 capsule every day by oral route as directed for 90 days.   Yes [provider]  metFORMIN (GLUCOPHAGE) 500 MG tablet Take 1 tablet (500 mg total) by mouth 2 (two) times daily with a meal. 08/09/16  Yes Kirichenko, Tatyana, PA-C  norethindrone (CAMILA) 0.35 MG tablet Take 1 tablet (0.35 mg total) by mouth daily. Take at the same time daily 09/09/19  Yes Aletha Halim, MD  Semaglutide,0.25 or 0.5MG /DOS, (OZEMPIC, 0.25 OR 0.5 MG/DOSE,) 2 MG/1.5ML SOPN Inject into the skin. 10/07/19  Yes [provider]  clotrimazole (LOTRIMIN) 1 %  cream Apply to affected area 2 times daily 05/06/20   Hall-Potvin, [place], PA-C  Lancets MISC Use to check blood glucose twice daily. 07/14/19   [provider]  triamcinolone cream (KENALOG) 0.1 % Apply 1 application topically 2 (two) times daily. 05/06/20   Hall-Potvin, [place], PA-C  atorvastatin (LIPITOR) 80 MG tablet Take 80 mg by mouth daily.  05/06/20  [provider]    Family History Family History  Problem Relation Age of Onset  . Diabetes Mother   . Hypertension Mother   . Gout Mother   . Crohn's disease Mother   . Osteoporosis Mother   . Kidney disease Mother        mild, related to DM  . Hyperlipidemia Mother   . Stroke Father 68  . Hypertension Father   . Seizures Father        related to stroke  . Hyperlipidemia Father   . Kidney Stones Brother   . Hyperlipidemia Maternal Grandmother   . Cancer Neg Hx   . Heart disease Neg Hx     Social History Social History   Tobacco Use  . Smoking status: Never Smoker  . Smokeless tobacco: Never Used  .  Tobacco comment: quit in early 20's  Vaping Use  . Vaping Use: Never used  Substance Use Topics  . Alcohol use: Yes    Comment: occasional-monthly  . Drug use: No     Allergies   Patient has no known allergies.   Review of Systems As per HPI   Physical Exam Triage Vital Signs ED Triage Vitals  Enc Vitals Group     BP      Pulse      Resp      Temp      Temp src      SpO2      Weight      Height      Head Circumference      Peak Flow      Pain Score      Pain Loc      Pain Edu?      Excl. in [place]?    No data found.  Updated Vital Signs BP 135/75 (BP Location: Left Arm)   Pulse 100   Temp 98.6 F (37 C) (Oral)   Resp 16   LMP 04/28/2020 (Exact Date)   SpO2 98%   Visual Acuity Right Eye Distance:   Left Eye Distance:   Bilateral Distance:    Right Eye Near:   Left Eye Near:    Bilateral Near:     Physical Exam Constitutional:      General: She is not in acute  distress. HENT:     Head: Normocephalic and atraumatic.  Eyes:     General: No scleral icterus.    Pupils: Pupils are equal, round, and reactive to light.  Cardiovascular:     Rate and Rhythm: Normal rate.  Pulmonary:     Effort: Pulmonary effort is normal.  Abdominal:     General: Bowel sounds are normal.     Palpations: Abdomen is soft.     Tenderness: There is no abdominal tenderness. There is no right CVA tenderness, left CVA tenderness or guarding.  Genitourinary:    Comments: Left labial dermatitis.  No Bartholin cyst appreciated. Skin:    Coloration: Skin is not jaundiced or pale.  Neurological:     Mental Status: She is alert and oriented to person, place, and time.      UC Treatments / Results  Labs (all labs ordered are listed, but only abnormal results are displayed) Labs Reviewed  POCT URINE PREGNANCY - Normal    EKG   Radiology No results found.  Procedures Procedures (including critical care time)  Medications Ordered in UC Medications - No data to display  Initial Impression / Assessment and Plan / UC Course  I have reviewed the triage vital signs and the nursing notes.  Pertinent labs & imaging results that were available during my care of the patient were reviewed by me and considered in my medical decision making (see chart for details).     H&P consistent with vulvar dermatitis.  Will treat supportively as outlined below.  Urine pregnancy negative.  Return precautions discussed, pt verbalized understanding and is agreeable to plan. Final Clinical Impressions(s) / UC Diagnoses   Final diagnoses:  Vulvar dermatitis     Discharge Instructions     1:1 ratio of creams up to 2 times daily. Sitz bath as needed.    ED Prescriptions    Medication Sig Dispense Auth. Provider   clotrimazole (LOTRIMIN) 1 % cream Apply to affected area 2 times daily 15 g Hall-Potvin, [place], PA-C  triamcinolone cream (KENALOG) 0.1 % Apply 1 application  topically 2 (two) times daily. 30 g Hall-Potvin, [place], PA-C     PDMP not reviewed this encounter.   Hall-Potvin, [place], [place] 05/06/20 1540

## 2020-05-06 NOTE — Discharge Instructions (Addendum)
1:1 ratio of creams up to 2 times daily. Sitz bath as needed.

## 2020-05-06 NOTE — ED Triage Notes (Signed)
Pt sts feels like vaginal area may have abrasions and possibly swollen with vaginal feeling raw and irritated. No intercourse prior to pain or after. Sts burning when pee but doesn't feel like the burning is while she is peeing, but more so her labia.

## 2020-09-05 ENCOUNTER — Other Ambulatory Visit: Payer: Self-pay

## 2020-09-05 ENCOUNTER — Ambulatory Visit
Admission: EM | Admit: 2020-09-05 | Discharge: 2020-09-05 | Disposition: A | Discharge status: Home / Self Care | Payer: Managed Care, Other (non HMO) | Attending: Emergency Medicine | Admitting: Emergency Medicine

## 2020-09-05 ENCOUNTER — Encounter: Payer: Self-pay | Admitting: Emergency Medicine

## 2020-09-05 DIAGNOSIS — J069 Acute upper respiratory infection, unspecified: Secondary | ICD-10-CM | POA: Diagnosis not present

## 2020-09-05 DIAGNOSIS — J029 Acute pharyngitis, unspecified: Secondary | ICD-10-CM

## 2020-09-05 DIAGNOSIS — Z20822 Contact with and (suspected) exposure to covid-19: Secondary | ICD-10-CM

## 2020-09-05 LAB — POCT RAPID STREP A (OFFICE): Rapid Strep A Screen: NEGATIVE

## 2020-09-05 MED ORDER — CETIRIZINE HCL 10 MG PO TABS: 10.0000 mg | ORAL_TABLET | Freq: Every day | ORAL | 0 refills | Status: DC

## 2020-09-05 MED ORDER — BENZONATATE 100 MG PO CAPS: 100.0000 mg | ORAL_CAPSULE | Freq: Three times a day (TID) | ORAL | 0 refills | Status: DC

## 2020-09-05 MED ORDER — FLUTICASONE PROPIONATE 50 MCG/ACT NA SUSP: 1.0000 | Freq: Every day | NASAL | 0 refills | Status: DC

## 2020-09-05 NOTE — ED Triage Notes (Signed)
Pt sts cough and pain with cough x 4 days with sore throat; denies fever

## 2020-09-05 NOTE — ED Provider Notes (Signed)
[name]    CSN: [id number] Arrival date & time: 09/05/20  0831      History   Chief Complaint Chief Complaint  Patient presents with  . Cough  . Sore Throat    HPI Stacey Byrd is a 39 y.o. female  History was provided by the patient. Stacey Byrd is a 39 y.o. female who presents for evaluation of a sore throat. Associated symptoms include dry cough, post nasal drip and sore throat. Onset of symptoms was 4 days ago, unchanged since that time.  She is drinking plenty of fluids. She has not had recent close exposure to someone with proven streptococcal pharyngitis.  Went to a concert 1 wk ago; no known sick contacts. The following portions of the patient's history were reviewed and updated as appropriate: allergies, current medications, past family history, past medical history, past social history, past surgical history and problem list.     Past Medical History:  Diagnosis Date  . Abnormal uterine bleeding 04/03/2016  . DM (diabetes mellitus), type 2 ([place])   . Eczema    sees dr Allyson Sabal  . Head ache   . Infection    UTI  . Kidney stone 2013   [facility]  . Normal pressure hydrocephalus ([place]) 08/2011   [facility]  . Obesity, unspecified     Patient Active Problem List   Diagnosis Date Noted  . Endometrial polyp 09/29/2016  . Unspecified vitamin D deficiency 02/17/2013  . Pure hypercholesterolemia 02/17/2013    Past Surgical History:  Procedure Laterality Date  . BREAST REDUCTION SURGERY  07/2010    OB History    Gravida  0   Para  0   Term  0   Preterm  0   AB  0   Living  0     SAB  0   TAB  0   Ectopic  0   Multiple  0   Live Births               Home Medications    Prior to Admission medications   Medication Sig Start Date End Date Taking? Authorizing Provider  benzonatate (TESSALON) 100 MG capsule Take 1 capsule (100 mg total) by mouth every 8 (eight) hours. 09/05/20   Hall-Potvin, [place], PA-C   cetirizine (ZYRTEC ALLERGY) 10 MG tablet Take 1 tablet (10 mg total) by mouth daily. 09/05/20   Hall-Potvin, [place], PA-C  Cholecalciferol 125 MCG (5000 UT) capsule cholecalciferol (vitamin D3) 125 mcg (5,000 unit) capsule  Take 1 capsule every day by oral route as directed for 90 days.    [provider]  clotrimazole (LOTRIMIN) 1 % cream Apply to affected area 2 times daily 05/06/20   Hall-Potvin, [place], PA-C  fluticasone (FLONASE) 50 MCG/ACT nasal spray Place 1 spray into both nostrils daily. 09/05/20   Hall-Potvin, [place], PA-C  Lancets MISC Use to check blood glucose twice daily. 07/14/19   [provider]  metFORMIN (GLUCOPHAGE) 500 MG tablet Take 1 tablet (500 mg total) by mouth 2 (two) times daily with a meal. 08/09/16   Kirichenko, Tatyana, PA-C  norethindrone (CAMILA) 0.35 MG tablet Take 1 tablet (0.35 mg total) by mouth daily. Take at the same time daily 09/09/19   Aletha Halim, MD  Semaglutide,0.25 or 0.5MG /DOS, (OZEMPIC, 0.25 OR 0.5 MG/DOSE,) 2 MG/1.5ML SOPN Inject into the skin. 10/07/19   [provider]  triamcinolone cream (KENALOG) 0.1 % Apply 1 application topically 2 (two) times daily. 05/06/20  Hall-Potvin, [place], PA-C  atorvastatin (LIPITOR) 80 MG tablet Take 80 mg by mouth daily.  05/06/20  [provider]    Family History Family History  Problem Relation Age of Onset  . Diabetes Mother   . Hypertension Mother   . Gout Mother   . Crohn's disease Mother   . Osteoporosis Mother   . Kidney disease Mother        mild, related to DM  . Hyperlipidemia Mother   . Stroke Father 45  . Hypertension Father   . Seizures Father        related to stroke  . Hyperlipidemia Father   . Kidney Stones Brother   . Hyperlipidemia Maternal Grandmother   . Cancer Neg Hx   . Heart disease Neg Hx     Social History Social History   Tobacco Use  . Smoking status: Never Smoker  . Smokeless tobacco: Never Used  . Tobacco comment:  quit in early 20's  Vaping Use  . Vaping Use: Never used  Substance Use Topics  . Alcohol use: Yes    Comment: occasional-monthly  . Drug use: No     Allergies   Patient has no known allergies.   Review of Systems Review of Systems  Constitutional: Negative for fatigue and fever.  HENT: Positive for postnasal drip and sore throat. Negative for congestion, dental problem, ear pain, facial swelling, hearing loss, sinus pain, trouble swallowing and voice change.   Eyes: Negative for photophobia, pain and visual disturbance.  Respiratory: Positive for cough. Negative for choking, shortness of breath and wheezing.   Cardiovascular: Negative for chest pain and palpitations.  Gastrointestinal: Negative for diarrhea and vomiting.  Musculoskeletal: Negative for arthralgias and myalgias.  Neurological: Negative for dizziness and headaches.     Physical Exam Triage Vital Signs ED Triage Vitals  Enc Vitals Group     BP      Pulse      Resp      Temp      Temp src      SpO2      Weight      Height      Head Circumference      Peak Flow      Pain Score      Pain Loc      Pain Edu?      Excl. in [place]?    No data found.  Updated Vital Signs BP 140/84 (BP Location: Left Arm)   Pulse 100   Temp 98.4 F (36.9 C) (Oral)   Resp 18   SpO2 98%   Visual Acuity Right Eye Distance:   Left Eye Distance:   Bilateral Distance:    Right Eye Near:   Left Eye Near:    Bilateral Near:     Physical Exam Constitutional:      General: She is not in acute distress.    Appearance: She is not ill-appearing or diaphoretic.  HENT:     Head: Normocephalic and atraumatic.     Jaw: There is normal jaw occlusion. No tenderness or pain on movement.     Right Ear: Hearing, tympanic membrane, ear canal and external ear normal. No tenderness. No mastoid tenderness.     Left Ear: Hearing, tympanic membrane, ear canal and external ear normal. No tenderness. No mastoid tenderness.     Nose: No  nasal deformity, septal deviation or nasal tenderness.     Right Turbinates: Not swollen or pale.  Left Turbinates: Not swollen or pale.     Right Sinus: No maxillary sinus tenderness or frontal sinus tenderness.     Left Sinus: No maxillary sinus tenderness or frontal sinus tenderness.     Mouth/Throat:     Lips: Pink. No lesions.     Mouth: Mucous membranes are moist. No injury.     Pharynx: Oropharynx is clear. Uvula midline. No oropharyngeal exudate, posterior oropharyngeal erythema or uvula swelling.     Comments: no tonsillar exudate or hypertrophy Eyes:     General: No scleral icterus.    Conjunctiva/sclera: Conjunctivae normal.     Pupils: Pupils are equal, round, and reactive to light.  Neck:     Comments: Trachea midline, negative JVD Cardiovascular:     Rate and Rhythm: Normal rate and regular rhythm.     Heart sounds: No murmur heard.  No gallop.   Pulmonary:     Effort: Pulmonary effort is normal. No respiratory distress.     Breath sounds: No wheezing, rhonchi or rales.  Musculoskeletal:     Cervical back: Normal range of motion and neck supple. Tenderness present. No muscular tenderness.  Lymphadenopathy:     Cervical: Cervical adenopathy present.  Skin:    Capillary Refill: Capillary refill takes less than 2 seconds.     Coloration: Skin is not jaundiced or pale.     Findings: No rash.  Neurological:     General: No focal deficit present.     Mental Status: She is alert and oriented to person, place, and time.      UC Treatments / Results  Labs (all labs ordered are listed, but only abnormal results are displayed) Labs Reviewed  NOVEL CORONAVIRUS, NAA  CULTURE, GROUP A STREP [facility])  POCT RAPID STREP A (OFFICE)    EKG   Radiology No results found.  Procedures Procedures (including critical care time)  Medications Ordered in UC Medications - No data to display  Initial Impression / Assessment and Plan / UC Course  I have reviewed the triage  vital signs and the nursing notes.  Pertinent labs & imaging results that were available during my care of the patient were reviewed by me and considered in my medical decision making (see chart for details).     Patient afebrile, nontoxic, with SpO2 98%.  Rapid strep negative, culture and Covid PCR pending.  Patient to quarantine until results are back.  We will treat supportively as outlined below.  Return precautions discussed, patient verbalized understanding and is agreeable to plan. Final Clinical Impressions(s) / UC Diagnoses   Final diagnoses:  Encounter for screening laboratory testing for COVID-19 virus  URI with cough and congestion  Sore throat     Discharge Instructions     Your rapid strep test was negative today.  The culture is pending.  Please look on your MyChart for test results.   We will notify you if the culture positive and outline a treatment plan at that time.   Please continue Tylenol and/or Ibuprofen as needed for fever, pain.  May try warm salt water gargles, cepacol lozenges, throat spray, warm tea or water with lemon/honey, or OTC cold relief medicine for throat discomfort.   For congestion: take a daily anti-histamine like Zyrtec, Claritin, and a oral decongestant to help with post nasal drip that may be irritating your throat.   It is important to stay hydrated: drink plenty of fluids (primarily water) to keep your throat moisturized and help further relieve irritation/discomfort.  ED Prescriptions    Medication Sig Dispense Auth. Provider   cetirizine (ZYRTEC ALLERGY) 10 MG tablet Take 1 tablet (10 mg total) by mouth daily. 30 tablet Hall-Potvin, [place], PA-C   fluticasone (FLONASE) 50 MCG/ACT nasal spray Place 1 spray into both nostrils daily. 16 g Hall-Potvin, [place], PA-C   benzonatate (TESSALON) 100 MG capsule Take 1 capsule (100 mg total) by mouth every 8 (eight) hours. 21 capsule Hall-Potvin, [place], PA-C     PDMP not reviewed this  encounter.   Neldon Mc Simla, [place] 09/05/20 [id number]

## 2020-09-05 NOTE — Discharge Instructions (Addendum)

## 2020-09-06 LAB — CULTURE, GROUP A STREP (THRC)

## 2020-09-06 LAB — SARS-COV-2, NAA 2 DAY TAT

## 2020-09-06 LAB — NOVEL CORONAVIRUS, NAA: SARS-CoV-2, NAA: NOT DETECTED

## 2020-09-08 LAB — CULTURE, GROUP A STREP (THRC)

## 2020-09-20 ENCOUNTER — Ambulatory Visit: Payer: Self-pay | Admitting: Obstetrics and Gynecology

## 2020-09-25 ENCOUNTER — Ambulatory Visit: Payer: Self-pay | Admitting: Nurse Practitioner

## 2020-12-03 ENCOUNTER — Encounter (HOSPITAL_COMMUNITY): Payer: Self-pay | Admitting: Emergency Medicine

## 2020-12-03 ENCOUNTER — Ambulatory Visit (HOSPITAL_COMMUNITY)
Admission: EM | Admit: 2020-12-03 | Discharge: 2020-12-03 | Disposition: A | Discharge status: Home / Self Care | Payer: Managed Care, Other (non HMO) | Attending: Emergency Medicine | Admitting: Emergency Medicine

## 2020-12-03 ENCOUNTER — Other Ambulatory Visit: Payer: Self-pay

## 2020-12-03 DIAGNOSIS — S161XXA Strain of muscle, fascia and tendon at neck level, initial encounter: Secondary | ICD-10-CM

## 2020-12-03 DIAGNOSIS — G44319 Acute post-traumatic headache, not intractable: Secondary | ICD-10-CM | POA: Diagnosis not present

## 2020-12-03 MED ORDER — ACETAMINOPHEN 325 MG PO TABS
975.0000 mg | ORAL_TABLET | Freq: Once | ORAL | Status: AC
  Administered 2020-12-03: 975 mg via ORAL

## 2020-12-03 MED ORDER — TIZANIDINE HCL 4 MG PO TABS: 4.0000 mg | ORAL_TABLET | Freq: Three times a day (TID) | ORAL | 0 refills | Status: DC | PRN

## 2020-12-03 MED ORDER — ACETAMINOPHEN 325 MG PO TABS
ORAL_TABLET | ORAL | Status: AC
  Filled 2020-12-03: qty 3

## 2020-12-03 MED ORDER — KETOROLAC TROMETHAMINE 30 MG/ML IJ SOLN
30.0000 mg | Freq: Once | INTRAMUSCULAR | Status: AC
  Administered 2020-12-03: 30 mg via INTRAMUSCULAR

## 2020-12-03 MED ORDER — KETOROLAC TROMETHAMINE 30 MG/ML IJ SOLN
INTRAMUSCULAR | Status: AC
  Filled 2020-12-03: qty 1

## 2020-12-03 MED ORDER — IBUPROFEN 600 MG PO TABS
600.0000 mg | ORAL_TABLET | Freq: Four times a day (QID) | ORAL | 0 refills | Status: DC | PRN
Start: 2020-12-03 — End: 2024-04-19

## 2020-12-03 NOTE — Discharge Instructions (Addendum)
Take 600 mg of ibuprofen combined with 1000 mg of Tylenol 3-4 times a day as needed.  Zanaflex for muscle spasms.  People tend to feel worse over the next several days, but most people are back to normal in 1 week. A small number of people will have persistent pain for up to six weeks.   Go immediately to the emergency department for visual changes, vomiting, worst headache of your life, arm or leg weakness, facial droop, slurred speech, chest pain, shortness of breath, abdominal pain, blood in your urine, or for any other concerns.  Below is a list of primary care practices who are taking new patients for you to follow-up with.  [facility] internal medicine clinic Ground Floor - [facility], [place], [place], [place] [address] [phone]  [facility] at [facility] [address] [place], [place] [address] [phone]  [place] [facility] [place], [place] [address] [phone]  [facility] [phone] [place] [address]  [facility]: [place]  [phone]  [place] [facility]: [place]   [phone]  [facility]: [address] [place]  [phone]  [place] [facility] : [address] [place] [phone]  [facility]: [address] [place] [phone]  [facility]: [place] [phone]  Dr. Blanchie Serve [place]  [phone]  Dr. Benito Mccreedy, [facility]. [place], [place] [address]  [phone]  Go to  www.goodrx.com to look up your medications. This will give you a list of where you can find your prescriptions at the most affordable prices. Or ask the pharmacist what the cash price is, or if they have any other discount programs available to help make your medication more affordable. This can be less expensive than what you would pay with insurance.
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
## 2020-12-10 ENCOUNTER — Encounter: Payer: Self-pay | Admitting: Obstetrics and Gynecology

## 2020-12-10 ENCOUNTER — Other Ambulatory Visit: Payer: Self-pay

## 2020-12-10 ENCOUNTER — Ambulatory Visit (INDEPENDENT_AMBULATORY_CARE_PROVIDER_SITE_OTHER): Payer: Managed Care, Other (non HMO) | Admitting: Obstetrics and Gynecology

## 2020-12-10 VITALS — BP 136/75 | HR 95 | Ht 65.0 in | Wt 231.7 lb

## 2020-12-10 DIAGNOSIS — Z3041 Encounter for surveillance of contraceptive pills: Secondary | ICD-10-CM | POA: Diagnosis not present

## 2020-12-10 DIAGNOSIS — N938 Other specified abnormal uterine and vaginal bleeding: Secondary | ICD-10-CM | POA: Diagnosis not present

## 2020-12-10 DIAGNOSIS — R350 Frequency of micturition: Secondary | ICD-10-CM | POA: Diagnosis not present

## 2020-12-10 DIAGNOSIS — Z01419 Encounter for gynecological examination (general) (routine) without abnormal findings: Secondary | ICD-10-CM

## 2020-12-10 MED ORDER — NORETHINDRONE 0.35 MG PO TABS
1.0000 | ORAL_TABLET | Freq: Every day | ORAL | 2 refills | Status: DC
Start: 2020-12-10 — End: 2022-03-06

## 2020-12-10 NOTE — Progress Notes (Addendum)
Obstetrics and Gynecology Annual Patient Evaluation  Appointment Date: 12/10/2020  OBGYN Clinic: Center for [facility]  Chief Complaint:  Chief Complaint  Patient presents with  . Gynecologic Exam    History of Present Illness: Stacey Byrd is a 40 y.o.  G0 (Patient's last menstrual period was 12/01/2020.), seen for the above chief complaint. Her past medical history is significant for DM2, h/o endometrial polyps   Patient ran out of Camila about a 44months ago but with normal, qmonth periods that aren't too heavy or painful and last for about a week.    Review of Systems: Pertinent items noted in HPI and remainder of comprehensive ROS otherwise negative.   Patient Active Problem List   Diagnosis Date Noted  . History of endometrial polyp 09/29/2016  . Unspecified vitamin D deficiency 02/17/2013  . Pure hypercholesterolemia 02/17/2013   Past Medical History:  Past Medical History:  Diagnosis Date  . Abnormal uterine bleeding 04/03/2016  . DM (diabetes mellitus), type 2 ([place])   . Eczema    sees dr Allyson Sabal  . Head ache   . Infection    UTI  . Kidney stone 2013   [facility]  . Normal pressure hydrocephalus ([place]) 08/2011   [facility]  . Obesity, unspecified     Past Surgical History:  Past Surgical History:  Procedure Laterality Date  . BREAST REDUCTION SURGERY  07/2010    Past Obstetrical History:  OB History  Gravida Para Term Preterm AB Living  0 0 0 0 0 0  SAB IAB Ectopic Multiple Live Births  0 0 0 0      Past Gynecological History: As per HPI. History of Pap Smear(s): Yes.   Last pap 2019, which was negative cytology and negative HPV  Social History:  Social History   Socioeconomic History  . Marital status: Single    Spouse name: Not on file  . Number of children: Not on file  . Years of education: Not on file  . Highest education level: Not on file  Occupational History  . 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 Smoking status: Never Smoker  . Smokeless tobacco: Never Used  . Tobacco comment: quit in early 20's  Vaping Use  . Vaping Use: Never used  Substance and Sexual Activity  . Alcohol use: Yes    Comment: occasional-monthly  . Drug use: No  . Sexual activity: Not on file  Other Topics Concern  . Not on file  Social History Narrative   Single, lives alone, no pets   Social Determinants of Health   Financial Resource Strain: Not on file  Food Insecurity: Not on file  Transportation Needs: Not on file  Physical Activity: Not on file  Stress: Not on file  Social Connections: Not on file  Intimate Partner Violence: Not on file    Family History:  Family History  Problem Relation Age of Onset  . Diabetes Mother   . Hypertension Mother   . Gout Mother   . Crohn's disease Mother   . Osteoporosis Mother   . Kidney disease Mother        mild, related to DM  . Hyperlipidemia Mother   . Stroke Father 21  . Hypertension Father   . Seizures Father        related to stroke  . Hyperlipidemia Father   . Kidney Stones Brother   .  Hyperlipidemia Maternal Grandmother   . Cancer Neg Hx   . Heart disease Neg Hx    She denies any female cancers  Medications Stacey Byrd had no medications administered during this visit. Current Outpatient Medications  Medication Sig Dispense Refill  . ibuprofen (ADVIL) 600 MG tablet Take 1 tablet (600 mg total) by mouth every 6 (six) hours as needed. 30 tablet 0  . metFORMIN (GLUCOPHAGE) 500 MG tablet Take 1 tablet (500 mg total) by mouth 2 (two) times daily with a meal. 60 tablet 1  . triamcinolone cream (KENALOG) 0.1 % Apply 1 application topically 2 (two) times daily. 30 g 0  . Varenicline Tartrate (TYRVAYA) 0.03 MG/ACT SOLN Place into the nose.    . Cholecalciferol 125 MCG (5000 UT) capsule cholecalciferol (vitamin D3) 125 mcg (5,000 unit) capsule  Take 1 capsule  every day by oral route as directed for 90 days. (Patient not taking: Reported on 12/10/2020)    . clotrimazole (LOTRIMIN) 1 % cream Apply to affected area 2 times daily (Patient not taking: Reported on 12/10/2020) 15 g 0  . fluticasone (FLONASE) 50 MCG/ACT nasal spray Place 1 spray into both nostrils daily. (Patient not taking: Reported on 12/10/2020) 16 g 0  . Lancets MISC Use to check blood glucose twice daily. (Patient not taking: Reported on 12/10/2020)    . Semaglutide,0.25 or 0.5MG /DOS, (OZEMPIC, 0.25 OR 0.5 MG/DOSE,) 2 MG/1.5ML SOPN Inject into the skin. (Patient not taking: Reported on 12/10/2020)    . tiZANidine (ZANAFLEX) 4 MG tablet Take 1 tablet (4 mg total) by mouth every 8 (eight) hours as needed for muscle spasms. (Patient not taking: Reported on 12/10/2020) 30 tablet 0   No current facility-administered medications for this visit.    Allergies Patient has no known allergies.   Physical Exam:  BP 136/75   Pulse 95   Ht 5\' 5"  (1.651 m)   Wt 231 lb 11.2 oz (105.1 kg)   LMP 12/01/2020   BMI 38.56 kg/m  Body mass index is 38.56 kg/m. General appearance: Well nourished, well developed female in no acute distress.  Neck:  Supple, normal appearance, and no thyromegaly  Cardiovascular: normal s1 and s2.  No murmurs, rubs or gallops. Respiratory:  Clear to auscultation bilateral. Normal respiratory effort Abdomen: positive bowel sounds and no masses, hernias; diffusely non tender to palpation, non distended Breasts: breasts appear normal, no suspicious masses, no skin or nipple changes or axillary nodes, and normal palpation. Well healed surgical scars. Neuro/Psych:  Normal mood and affect.  Skin:  Warm and dry.  Lymphatic:  No inguinal lymphadenopathy.   Pelvic exam: is not limited by body habitus EGBUS: within normal limits Vagina: within normal limits and with no blood or discharge in the vault Cervix: normal appearing cervix without tenderness, discharge or lesions. Uterus:   nonenlarged and non tender Adnexa:  normal adnexa and no mass, fullness, tenderness Rectovaginal: deferred  Laboratory: none  Radiology: none  Assessment: pt doing well  Plan:  1. Well woman exam with routine gynecological exam Routine care. Camila refill sent in. U/a and culture sent in for increased frequency.  - MM Digital Screening; Future  Orders Placed This Encounter  Procedures  . MM Digital Screening    RTC 1 year and PRN  Durene Romans MD Attending Center for [facility])

## 2020-12-10 NOTE — Addendum Note (Signed)
Addended by: Aletha Halim on: 12/10/2020 01:12 PM   Modules accepted: Orders

## 2020-12-10 NOTE — Addendum Note (Signed)
Addended by: Bethanne Ginger on: 12/10/2020 01:39 PM   Modules accepted: Orders

## 2020-12-11 LAB — POCT URINALYSIS DIP (DEVICE)
Bilirubin Urine: NEGATIVE
Glucose, UA: NEGATIVE mg/dL
Hgb urine dipstick: NEGATIVE
Ketones, ur: NEGATIVE mg/dL
Leukocytes,Ua: NEGATIVE
Nitrite: NEGATIVE
Protein, ur: NEGATIVE mg/dL
Specific Gravity, Urine: 1.025 (ref 1.005–1.030)
Urobilinogen, UA: 0.2 mg/dL (ref 0.0–1.0)
pH: 6.5 (ref 5.0–8.0)

## 2020-12-13 LAB — URINE CULTURE

## 2020-12-14 MED ORDER — SULFAMETHOXAZOLE-TRIMETHOPRIM 800-160 MG PO TABS
1.0000 | ORAL_TABLET | Freq: Two times a day (BID) | ORAL | 0 refills | Status: DC
Start: 2020-12-14 — End: 2020-12-19

## 2020-12-14 NOTE — Addendum Note (Signed)
Addended by: Aletha Halim on: 12/14/2020 08:30 AM   Modules accepted: Orders

## 2020-12-19 ENCOUNTER — Telehealth: Payer: Self-pay | Admitting: Lactation Services

## 2020-12-19 ENCOUNTER — Other Ambulatory Visit: Payer: Self-pay

## 2020-12-19 DIAGNOSIS — Z01419 Encounter for gynecological examination (general) (routine) without abnormal findings: Secondary | ICD-10-CM

## 2020-12-19 MED ORDER — SULFAMETHOXAZOLE-TRIMETHOPRIM 800-160 MG PO TABS: 1.0000 | ORAL_TABLET | Freq: Two times a day (BID) | ORAL | 0 refills | Status: AC

## 2020-12-19 NOTE — Telephone Encounter (Signed)
Received message that patient has not read My Chart Message. Called patient and LM on voicemail for patient to check My Chart message and that prescription was sent to her pharmacy.

## 2021-07-23 ENCOUNTER — Encounter: Payer: Self-pay | Admitting: Family Medicine

## 2021-07-23 ENCOUNTER — Other Ambulatory Visit: Payer: Self-pay

## 2021-07-23 ENCOUNTER — Ambulatory Visit (INDEPENDENT_AMBULATORY_CARE_PROVIDER_SITE_OTHER): Payer: No Typology Code available for payment source

## 2021-07-23 VITALS — BP 136/84 | HR 90 | Ht 63.0 in | Wt 232.5 lb

## 2021-07-23 DIAGNOSIS — N9089 Other specified noninflammatory disorders of vulva and perineum: Secondary | ICD-10-CM | POA: Diagnosis not present

## 2021-07-23 MED ORDER — VALACYCLOVIR HCL 500 MG PO TABS: 500.0000 mg | ORAL_TABLET | Freq: Two times a day (BID) | ORAL | 0 refills | Status: AC

## 2021-07-23 NOTE — Progress Notes (Signed)
Pt seen and examined. Chaperone present during exam Possible HSV. Cultured. Reviewed with pt. Information provided as well. Will start Valtrex.   Arlina Robes, MD

## 2021-07-23 NOTE — Patient Instructions (Signed)
Genital Herpes Genital herpes is a common sexually transmitted infection (STI) that is caused by a virus. The virus spreads from person to person through sexual contact. The infection can cause itching, blisters, and sores around the genitals or rectum. Symptoms may last for several days and then go away. This is called an outbreak. The virus remains in the body, however, so more outbreaks may happen in the future. The time between outbreaks varies and can be from months to years. Genital herpes can affect anyone. It is particularly concerning for pregnant women because the virus can be passed to the baby during delivery and can cause serious problems. Genital herpes is also a concern for people who have a weak disease-fighting system (immune system). What are the causes? This condition is caused by the human herpesvirus, also called herpes simplex virus, type 1 or type 2 (HSV-1 or HSV-2). The virus may spread through: Sexual contact with an infected person, including vaginal, anal, and oral sex. Contact with fluid from a herpes sore. The skin. This means that you can get herpes from an infected partner even if there are no blisters or sores present. Your partner may not know that he or she is infected. What increases the risk? You are more likely to develop this condition if: You have sex with many partners. You do not use latex condoms during sex. What are the signs or symptoms? Most people do not have symptoms (are asymptomatic), or they have mild symptoms that may be mistaken for other skin problems. Symptoms may include: Small, red bumps near the genitals, rectum, or mouth. These bumps turn into blisters and then sores. Flu-like symptoms, including: Fever. Body aches. Swollen lymph nodes. Headache. Painful urination. Pain and itching in the genital area or rectal area. Vaginal discharge. Tingling or shooting pain in the legs and buttocks. Generally, symptoms are more severe and last  longer during the first (primary) outbreak. Flu-like symptoms are also more common during the primary outbreak. How is this diagnosed? This condition may be diagnosed based on: A physical exam. Your medical history. Blood tests. A test of a fluid sample (culture) from an open sore. How is this treated? There is no cure for this condition, but treatment with antiviral medicines that are taken by mouth (orally) can do the following: Speed up healing and relieve symptoms. Help to reduce the spread of the virus to sexual partners. Limit the chance of future outbreaks, or make future outbreaks shorter. Lessen symptoms of future outbreaks. Your health care provider may also recommend pain relief medicines, such as aspirin or ibuprofen. Follow these instructions at home: If you have an outbreak: Keep the affected areas dry and clean. Avoid rubbing or touching blisters and sores. If you do touch blisters or sores: Wash your hands thoroughly with soap and water. Do not touch your eyes afterward. To help relieve pain or itching, you may take the following actions as told by your health care provider: Apply a cold, wet cloth (cold compress) to affected areas 4-6 times a day. Apply a substance that protects your skin and reduces bleeding (astringent). Apply a gel that helps relieve pain around sores (lidocaine gel). Take a warm, shallow bath that cleans the genital area (sitz bath). Sexual activity Do not have sexual contact during active outbreaks. Practice safe sex. Herpes can spread even if your partner does not have blisters or sores. Latex condoms and female condoms may help prevent the spread of the herpes virus. General instructions Take over-the-counter and  prescription medicines only as told by your health care provider. Keep all follow-up visits as told by your health care provider. This is important. How is this prevented? Use condoms. Although you can get genital herpes during sexual  contact even with the use of a condom, a condom can provide some protection. Avoid having multiple sexual partners. Talk with your sexual partner about any symptoms either of you may have. Also, talk with your partner about any history of STIs. Get tested for STIs before you have sex. Ask your partner to do the same. Do not have sexual contact if you have active symptoms of genital herpes. Contact a health care provider if: Your symptoms are not improving with medicine. Your symptoms return, or you have new symptoms. You have a fever. You have abdominal pain. You have redness, swelling, or pain in your eye. You notice new sores on other parts of your body. You are a woman and you experience bleeding between menstrual periods. You have had herpes and you become pregnant or plan to become pregnant. Summary Genital herpes is a common sexually transmitted infection (STI) that is caused by the herpes simplex virus, type 1 or type 2 (HSV-1 or HSV-2). These viruses are most often spread through sexual contact with an infected person. You are more likely to develop this condition if you have sex with many partners or you do not use condoms during sex. Most people do not have symptoms (are asymptomatic) or have mild symptoms that may be mistaken for other skin problems. Symptoms occur as outbreaks that may happen months or years apart. There is no cure for this condition, but treatment with oral antiviral medicines can reduce symptoms, reduce the chance of spreading the virus to a partner, prevent future outbreaks, or shorten future outbreaks. This information is not intended to replace advice given to you by your health care provider. Make sure you discuss any questions you have with your health care provider. Document Revised: 06/16/2019 Document Reviewed: 06/16/2019 Elsevier Patient Education  [place].

## 2021-07-23 NOTE — Progress Notes (Signed)
Pt states here today for left labial bump for past 5-7 days. Pt states tender when touching. Denies any drainage or fever. Pt denies hx of HSV. Pt is not currently sexually active x 1 year. Reviewed notes with Dr Rip Harbour. Dr Rip Harbour to see pt for exam. HSV swab collected. Advised to start taking Valtrex 500mg  BID for 10 days. Rx sent to pharmacy on file. Pt verbalized understanding and agreeable to plan of care. Pt has next AEX in Feb 2023.  Colletta Maryland, RN

## 2021-07-25 LAB — HERPES SIMPLEX VIRUS CULTURE

## 2022-02-13 ENCOUNTER — Encounter: Payer: Self-pay | Admitting: Obstetrics and Gynecology

## 2022-03-03 ENCOUNTER — Other Ambulatory Visit: Payer: Self-pay | Admitting: Obstetrics and Gynecology

## 2022-03-03 DIAGNOSIS — Z1231 Encounter for screening mammogram for malignant neoplasm of breast: Secondary | ICD-10-CM

## 2022-03-06 ENCOUNTER — Encounter: Payer: Self-pay | Admitting: Obstetrics and Gynecology

## 2022-03-06 ENCOUNTER — Ambulatory Visit
Admission: RE | Admit: 2022-03-06 | Discharge: 2022-03-06 | Disposition: A | Discharge status: Home / Self Care | Payer: PRIVATE HEALTH INSURANCE | Source: Ambulatory Visit | Attending: Obstetrics and Gynecology | Admitting: Obstetrics and Gynecology

## 2022-03-06 ENCOUNTER — Encounter: Payer: Self-pay | Admitting: Family Medicine

## 2022-03-06 ENCOUNTER — Ambulatory Visit (INDEPENDENT_AMBULATORY_CARE_PROVIDER_SITE_OTHER): Payer: PRIVATE HEALTH INSURANCE | Admitting: Obstetrics and Gynecology

## 2022-03-06 ENCOUNTER — Other Ambulatory Visit (HOSPITAL_COMMUNITY)
Admission: RE | Admit: 2022-03-06 | Discharge: 2022-03-06 | Disposition: A | Discharge status: Home / Self Care | Payer: PRIVATE HEALTH INSURANCE | Source: Ambulatory Visit | Attending: Obstetrics and Gynecology | Admitting: Obstetrics and Gynecology

## 2022-03-06 VITALS — BP 130/98 | HR 87 | Ht 62.0 in | Wt 233.6 lb

## 2022-03-06 DIAGNOSIS — Z1151 Encounter for screening for human papillomavirus (HPV): Secondary | ICD-10-CM

## 2022-03-06 DIAGNOSIS — I10 Essential (primary) hypertension: Secondary | ICD-10-CM | POA: Diagnosis not present

## 2022-03-06 DIAGNOSIS — Z01419 Encounter for gynecological examination (general) (routine) without abnormal findings: Secondary | ICD-10-CM | POA: Diagnosis not present

## 2022-03-06 DIAGNOSIS — Z3041 Encounter for surveillance of contraceptive pills: Secondary | ICD-10-CM | POA: Diagnosis not present

## 2022-03-06 DIAGNOSIS — Z1231 Encounter for screening mammogram for malignant neoplasm of breast: Secondary | ICD-10-CM

## 2022-03-06 MED ORDER — NORETHINDRONE 0.35 MG PO TABS: 1.0000 | ORAL_TABLET | Freq: Every day | ORAL | 2 refills | Status: DC

## 2022-03-06 NOTE — Progress Notes (Signed)
Obstetrics and Gynecology Annual Patient Evaluation  Appointment Date: 03/06/2022  OBGYN Clinic: [facility] for [facility]  Primary Care Provider: None  Referring Provider: No ref. provider found  Chief Complaint:  Chief Complaint  Patient presents with   Gynecologic Exam    History of Present Illness: Stacey Byrd is a 41 y.o. G0(Patient's last menstrual period was 02/13/2022 (approximate).), seen for the above chief complaint. Her past medical history is significant for HTN, DM2, BMI 76s, h/o endometrial polyps   No problems or issues  Review of Systems: A comprehensive review of systems was negative.   Patient Active Problem List   Diagnosis Date Noted   History of endometrial polyp 09/29/2016   Unspecified vitamin D deficiency 02/17/2013   Pure hypercholesterolemia 02/17/2013    Past Medical History:  Past Medical History:  Diagnosis Date   Abnormal uterine bleeding 04/03/2016   DM (diabetes mellitus), type 2 ([place])    Eczema    sees dr Allyson Sabal   Head ache    Infection    UTI   Kidney stone 2013   [facility]   Normal pressure hydrocephalus ([place]) 08/2011   [facility]   Obesity, unspecified     Past Surgical History:  Past Surgical History:  Procedure Laterality Date   BREAST REDUCTION SURGERY  07/2010   REDUCTION MAMMAPLASTY Bilateral 2010    Past Obstetrical History:  OB History  Gravida Para Term Preterm AB Living  0 0 0 0 0 0  SAB IAB Ectopic Multiple Live Births  0 0 0 0      Past Gynecological History: As per HPI. Periods: qmonth, regular, <1wk History of Pap Smear(s): Yes.   Last pap 2019, which was cytology and HPV negative She is currently using  Camila  for contraception.    Social History:  Social History   Socioeconomic History   Marital status: Single    Spouse name: Not on file   Number of children: Not on file   Years of education: Not on file   Highest education level: Not on file  Occupational  History   Occupation: Environmental consultant to eye doctor [place]    Employer: [employer]  Tobacco Use   Smoking status: Never   Smokeless tobacco: Never   Tobacco comments:    quit in early 20's  Vaping Use   Vaping Use: Never used  Substance and Sexual Activity   Alcohol use: Yes    Comment: occasional-monthly   Drug use: No   Sexual activity: Not Currently    Birth control/protection: None  Other Topics Concern   Not on file  Social History Narrative   Single, lives alone, no pets   Social Determinants of Health   Financial Resource Strain: Not on file  Food Insecurity: Not on file  Transportation Needs: Not on file  Physical Activity: Not on file  Stress: Not on file  Social Connections: Not on file  Intimate Partner Violence: Not on file    Family History:  Family History  Problem Relation Age of Onset   Diabetes Mother    Hypertension Mother    Gout Mother    Crohn's disease Mother    Osteoporosis Mother    Kidney disease Mother        mild, related to DM   Hyperlipidemia Mother    Stroke Father 59   Hypertension Father    Seizures Father        related to stroke  Hyperlipidemia Father    Kidney Stones Brother    Hyperlipidemia Maternal Grandmother    Cancer Neg Hx    Heart disease Neg Hx     Medications Francena T. Venkatesh had no medications administered during this visit. Current Outpatient Medications  Medication Sig Dispense Refill   atorvastatin (LIPITOR) 80 MG tablet Take 1 tablet by mouth daily.     clobetasol ointment (TEMOVATE) 0.05 % Apply twice a day to affected area for 2 weeks,  Then once a day for two weeks. After that apply every other day     ibuprofen (ADVIL) 600 MG tablet Take 1 tablet (600 mg total) by mouth every 6 (six) hours as needed. 30 tablet 0   metFORMIN (GLUCOPHAGE) 500 MG tablet Take 1 tablet (500 mg total) by mouth 2 (two) times daily with a meal. 60 tablet 1   Semaglutide,0.25 or 0.'5MG'$ /DOS, (OZEMPIC, 0.25  OR 0.5 MG/DOSE,) 2 MG/1.5ML SOPN Inject into the skin.     Cholecalciferol 125 MCG (5000 UT) capsule cholecalciferol (vitamin D3) 125 mcg (5,000 unit) capsule  Take 1 capsule every day by oral route as directed for 90 days. (Patient not taking: Reported on 12/10/2020)     norethindrone (CAMILA) 0.35 MG tablet Take 1 tablet (0.35 mg total) by mouth daily. Take at the same time daily 180 tablet 2   Varenicline Tartrate (TYRVAYA) 0.03 MG/ACT SOLN Place into the nose. (Patient not taking: Reported on 03/06/2022)     No current facility-administered medications for this visit.    Allergies Patient has no known allergies.   Physical Exam:  BP (!) 130/98   Pulse 87   Ht '5\' 2"'$  (1.575 m)   Wt 233 lb 9.6 oz (106 kg)   LMP 02/13/2022 (Approximate)   BMI 42.73 kg/m  Body mass index is 42.73 kg/m. General appearance: Well nourished, well developed female in no acute distress.  Neck:  Supple, normal appearance, and no thyromegaly  Cardiovascular: normal s1 and s2.  No murmurs, rubs or gallops. Respiratory:  Clear to auscultation bilateral. Normal respiratory effort Abdomen: positive bowel sounds and no masses, hernias; diffusely non tender to palpation, non distended Breasts: patient declines to have breast exam. Neuro/Psych:  Normal mood and affect.  Skin:  Warm and dry.  Lymphatic:  No inguinal lymphadenopathy.   Pelvic exam: is limited by body habitus EGBUS: within normal limits Vagina: within normal limits and with no blood or discharge in the vault Cervix: normal appearing cervix without tenderness, discharge or lesions. Uterus:  nonenlarged and non tender Adnexa:  normal adnexa and no mass, fullness, tenderness Rectovaginal: deferred  Laboratory: none  Radiology: none  Assessment: pt doing well  Plan:  1. Screening for HPV (human papillomavirus) Recommend pap today since not in monogamous relationship. Declines other STI testing - Cytology - PAP( [place])  2. Well woman  exam with routine gynecological exam Refill for camila sent in. Follow up mammogram results from today - norethindrone (CAMILA) 0.35 MG tablet; Take 1 tablet (0.35 mg total) by mouth daily. Take at the same time daily  Dispense: 180 tablet; Refill: 2  3. Encounter for surveillance of contraceptive pills - norethindrone (CAMILA) 0.35 MG tablet; Take 1 tablet (0.35 mg total) by mouth daily. Take at the same time daily  Dispense: 180 tablet; Refill: 2  4. Hypertension, unspecified type Pt doesn't have pcp. Will refer for one  RTC 9yr CDurene RomansMD Attending [facility] [employer]

## 2022-03-07 ENCOUNTER — Other Ambulatory Visit: Payer: Self-pay | Admitting: Obstetrics and Gynecology

## 2022-03-07 DIAGNOSIS — R928 Other abnormal and inconclusive findings on diagnostic imaging of breast: Secondary | ICD-10-CM

## 2022-03-10 LAB — CYTOLOGY - PAP
Comment: NEGATIVE
Diagnosis: NEGATIVE
High risk HPV: NEGATIVE

## 2022-03-30 ENCOUNTER — Ambulatory Visit
Admission: EM | Admit: 2022-03-30 | Discharge: 2022-03-30 | Disposition: A | Discharge status: Home / Self Care | Payer: PRIVATE HEALTH INSURANCE | Attending: Emergency Medicine | Admitting: Emergency Medicine

## 2022-03-30 DIAGNOSIS — J029 Acute pharyngitis, unspecified: Secondary | ICD-10-CM

## 2022-03-30 DIAGNOSIS — H66001 Acute suppurative otitis media without spontaneous rupture of ear drum, right ear: Secondary | ICD-10-CM | POA: Diagnosis not present

## 2022-03-30 DIAGNOSIS — J040 Acute laryngitis: Secondary | ICD-10-CM | POA: Diagnosis not present

## 2022-03-30 LAB — POCT RAPID STREP A (OFFICE): Rapid Strep A Screen: NEGATIVE

## 2022-03-30 MED ORDER — METHYLPREDNISOLONE 8 MG PO TABS: 16.0000 mg | ORAL_TABLET | Freq: Every day | ORAL | 0 refills | Status: AC

## 2022-03-30 MED ORDER — PROMETHAZINE-DM 6.25-15 MG/5ML PO SYRP: 5.0000 mL | ORAL_SOLUTION | Freq: Four times a day (QID) | ORAL | 0 refills | Status: DC | PRN

## 2022-03-30 MED ORDER — CEFDINIR 300 MG PO CAPS: 300.0000 mg | ORAL_CAPSULE | Freq: Two times a day (BID) | ORAL | 0 refills | Status: AC

## 2022-03-30 NOTE — Discharge Instructions (Signed)
For the severe swelling and pain in your throat which may or may not be related to the infection in your right ear, please begin methylprednisolone 2 tablets once daily for 3 days.  For the infection in your ear, please begin cefdinir 1 capsule in the morning in the evening for the next 10 days.  Is very important that you finish the full 10-day course to be sure that the infection is completely resolved.  Please follow-up in the next 7 to 10 days if you not have meaningful relief of your symptoms.  Thank you for visiting urgent care today.

## 2022-03-30 NOTE — ED Triage Notes (Signed)
C/o sore throat x 3 days.

## 2022-03-30 NOTE — ED Provider Notes (Signed)
[facility]    CSN: [id number] Arrival date & time: 03/30/22  0920    HISTORY   Chief Complaint  Patient presents with   Sore Throat   HPI Stacey Byrd is a 41 y.o. female. Patient presents urgent care today with a 3-day history of progressively worsening sore throat.  Patient states she is also completely lost her voice as of this morning.  Patient reports significant pain with swallowing.  Patient states she is also been having headache and slight cough.  She denies nausea, vomiting, diarrhea.  Patient denies known sick contacts.  Patient is requesting a note to be out of work until her voice comes back.  Rapid strep test today is negative.  The history is provided by the patient.   Past Medical History:  Diagnosis Date   Abnormal uterine bleeding 04/03/2016   DM (diabetes mellitus), type 2 ([place])    Eczema    sees dr Allyson Sabal   Head ache    Infection    UTI   Kidney stone 2013   [facility]   Normal pressure hydrocephalus ([place]) 08/2011   [facility]   Obesity, unspecified    Patient Active Problem List   Diagnosis Date Noted   History of endometrial polyp 09/29/2016   Unspecified vitamin D deficiency 02/17/2013   Pure hypercholesterolemia 02/17/2013   Past Surgical History:  Procedure Laterality Date   BREAST REDUCTION SURGERY  07/2010   REDUCTION MAMMAPLASTY Bilateral 2010   OB History     Gravida  0   Para  0   Term  0   Preterm  0   AB  0   Living  0      SAB  0   IAB  0   Ectopic  0   Multiple  0   Live Births             Home Medications    Prior to Admission medications   Medication Sig Start Date End Date Taking? Authorizing Provider  atorvastatin (LIPITOR) 80 MG tablet Take 1 tablet by mouth daily. 07/12/21   [provider]  clobetasol ointment (TEMOVATE) 0.05 % Apply twice a day to affected area for 2 weeks,  Then once a day for two weeks. After that apply every other day 06/10/21   [provider]  ibuprofen (ADVIL) 600 MG tablet Take 1 tablet (600 mg total) by mouth every 6 (six) hours as needed. 12/03/20   Melynda Ripple, MD  metFORMIN (GLUCOPHAGE) 500 MG tablet Take 1 tablet (500 mg total) by mouth 2 (two) times daily with a meal. 08/09/16   Kirichenko, Lahoma Rocker, PA-C  norethindrone (CAMILA) 0.35 MG tablet Take 1 tablet (0.35 mg total) by mouth daily. Take at the same time daily 03/06/22   Aletha Halim, MD  Semaglutide,0.25 or 0.'5MG'$ /DOS, (OZEMPIC, 0.25 OR 0.5 MG/DOSE,) 2 MG/1.5ML SOPN Inject into the skin. 10/07/19   [provider]  cetirizine (ZYRTEC ALLERGY) 10 MG tablet Take 1 tablet (10 mg total) by mouth daily. 09/05/20 12/03/20  Hall-Potvin, [place], PA-C   Family History Family History  Problem Relation Age of Onset   Diabetes Mother    Hypertension Mother    Gout Mother    Crohn's disease Mother    Osteoporosis Mother    Kidney disease Mother        mild, related to DM   Hyperlipidemia Mother    Stroke Father 102   Hypertension Father    Seizures Father  related to stroke   Hyperlipidemia Father    Kidney Stones Brother    Hyperlipidemia Maternal Grandmother    Cancer Neg Hx    Heart disease Neg Hx    Social History Social History   Tobacco Use   Smoking status: Never   Smokeless tobacco: Never   Tobacco comments:    quit in early 20's  Vaping Use   Vaping Use: Never used  Substance Use Topics   Alcohol use: Yes    Comment: occasional-monthly   Drug use: No   Allergies   Patient has no known allergies.  Review of Systems Review of Systems Pertinent findings noted in history of present illness.   Physical Exam Triage Vital Signs ED Triage Vitals  Enc Vitals Group     BP 08/16/21 0827 (!) 147/82     Pulse Rate 08/16/21 0827 72     Resp 08/16/21 0827 18     Temp 08/16/21 0827 98.3 F (36.8 C)     Temp Source 08/16/21 0827 Oral     SpO2 08/16/21 0827 98 %     Weight --      Height --      Head Circumference --       Peak Flow --      Pain Score 08/16/21 0826 5     Pain Loc --      Pain Edu? --      Excl. in [place]? --   No data found.  Updated Vital Signs BP 132/84 (BP Location: Left Arm)   Pulse (!) 112   Temp 99.4 F (37.4 C) (Oral)   Resp 18   LMP 02/13/2022 (Approximate)   SpO2 95%   Physical Exam Constitutional:      General: She is not in acute distress.    Appearance: She is well-developed. She is ill-appearing. She is not toxic-appearing.  HENT:     Head: Normocephalic and atraumatic.     Salivary Glands: Right salivary gland is diffusely enlarged and tender. Left salivary gland is diffusely enlarged and tender.     Right Ear: Hearing and external ear normal.     Left Ear: Hearing and external ear normal.     Ears:     Comments: Bilateral EACs with mild erythema, bilateral TMs are normal    Nose: No mucosal edema, congestion or rhinorrhea.     Right Turbinates: Not enlarged, swollen or pale.     Left Turbinates: Not enlarged or swollen.     Right Sinus: No maxillary sinus tenderness or frontal sinus tenderness.     Left Sinus: No maxillary sinus tenderness or frontal sinus tenderness.     Mouth/Throat:     Lips: Pink. No lesions.     Mouth: Mucous membranes are moist. No oral lesions or angioedema.     Dentition: No gingival swelling.     Tongue: No lesions.     Palate: No mass.     Pharynx: Uvula midline. Pharyngeal swelling, posterior oropharyngeal erythema and uvula swelling present. No oropharyngeal exudate.     Tonsils: No tonsillar exudate. 1+ on the right. 1+ on the left.  Eyes:     Extraocular Movements: Extraocular movements intact.     Conjunctiva/sclera: Conjunctivae normal.     Pupils: Pupils are equal, round, and reactive to light.  Neck:     Thyroid: No thyroid mass, thyromegaly or thyroid tenderness.     Trachea: Tracheal tenderness present. No abnormal tracheal secretions or tracheal deviation.  Comments: Voice is muffled Cardiovascular:     Rate and  Rhythm: Normal rate and regular rhythm.     Pulses: Normal pulses.     Heart sounds: Normal heart sounds, S1 normal and S2 normal. No murmur heard.    No friction rub. No gallop.  Pulmonary:     Effort: Pulmonary effort is normal. No accessory muscle usage, prolonged expiration, respiratory distress or retractions.     Breath sounds: No stridor, decreased air movement or transmitted upper airway sounds. No decreased breath sounds, wheezing, rhonchi or rales.  Abdominal:     General: Bowel sounds are normal.     Palpations: Abdomen is soft.     Tenderness: There is generalized abdominal tenderness. There is no right CVA tenderness, left CVA tenderness or rebound. Negative signs include Murphy's sign.     Hernia: No hernia is present.  Musculoskeletal:        General: No tenderness. Normal range of motion.     Cervical back: Full passive range of motion without pain, normal range of motion and neck supple.     Right lower leg: No edema.     Left lower leg: No edema.  Lymphadenopathy:     Cervical: Cervical adenopathy present.     Right cervical: Superficial cervical adenopathy and posterior cervical adenopathy present.     Left cervical: Superficial cervical adenopathy present. No posterior cervical adenopathy.  Skin:    General: Skin is warm and dry.     Findings: No erythema, lesion or rash.  Neurological:     General: No focal deficit present.     Mental Status: She is alert and oriented to person, place, and time. Mental status is at baseline.  Psychiatric:        Mood and Affect: Mood normal.        Behavior: Behavior normal.        Thought Content: Thought content normal.        Judgment: Judgment normal.     Visual Acuity Right Eye Distance:   Left Eye Distance:   Bilateral Distance:    Right Eye Near:   Left Eye Near:    Bilateral Near:     UC Couse / Diagnostics / Procedures:    EKG  Radiology No results found.  Procedures Procedures (including critical care  time)  UC Diagnoses / Final Clinical Impressions(s)   I have reviewed the triage vital signs and the nursing notes.  Pertinent labs & imaging results that were available during my care of the patient were reviewed by me and considered in my medical decision making (see chart for details).   Final diagnoses:  Acute pharyngitis, unspecified etiology  Laryngitis, acute  Acute suppurative otitis media of right ear   Please see details of plan of care in discharge directions below.  Return precautions advised.  ED Prescriptions     Medication Sig Dispense Auth. Provider   cefdinir (OMNICEF) 300 MG capsule Take 1 capsule (300 mg total) by mouth 2 (two) times daily for 10 days. 20 capsule Lynden Oxford Scales, PA-C   methylPREDNISolone (MEDROL) 8 MG tablet Take 2 tablets (16 mg total) by mouth daily for 3 days. 6 tablet Lynden Oxford Scales, PA-C   promethazine-dextromethorphan (PROMETHAZINE-DM) 6.25-15 MG/5ML syrup Take 5 mLs by mouth 4 (four) times daily as needed for cough. 118 mL Lynden Oxford Scales, PA-C      PDMP not reviewed this encounter.  Pending results:  Labs Reviewed  POCT RAPID STREP A (  OFFICE)    Medications Ordered in UC: Medications - No data to display  Disposition Upon Discharge:  Condition: stable for discharge home Home: take medications as prescribed; routine discharge instructions as discussed; follow up as advised.  Patient presented with an acute illness with associated systemic symptoms and significant discomfort requiring urgent management. In my opinion, this is a condition that a prudent lay person (someone who possesses an average knowledge of health and medicine) may potentially expect to result in complications if not addressed urgently such as respiratory distress, impairment of bodily function or dysfunction of bodily organs.   Routine symptom specific, illness specific and/or disease specific instructions were discussed with the patient and/or  caregiver at length.   As such, the patient has been evaluated and assessed, work-up was performed and treatment was provided in alignment with urgent care protocols and evidence based medicine.  Patient/parent/caregiver has been advised that the patient may require follow up for further testing and treatment if the symptoms continue in spite of treatment, as clinically indicated and appropriate.  If the patient was tested for COVID-19, Influenza and/or RSV, then the patient/parent/guardian was advised to isolate at home pending the results of his/her diagnostic coronavirus test and potentially longer if they're positive. I have also advised pt that if his/her COVID-19 test returns positive, it's recommended to self-isolate for at least 10 days after symptoms first appeared AND until fever-free for 24 hours without fever reducer AND other symptoms have improved or resolved. Discussed self-isolation recommendations as well as instructions for household member/close contacts as per the [facility] and [place] DH[facility], and also gave patient the [place] packet with this information.  Patient/parent/caregiver has been advised to return to the [facility] or PCP in 3-5 days if no better; to PCP or the Emergency Department if new signs and symptoms develop, or if the current signs or symptoms continue to change or worsen for further workup, evaluation and treatment as clinically indicated and appropriate  The patient will follow up with their current PCP if and as advised. If the patient does not currently have a PCP we will assist them in obtaining one.   The patient may need specialty follow up if the symptoms continue, in spite of conservative treatment and management, for further workup, evaluation, consultation and treatment as clinically indicated and appropriate.  Patient/parent/caregiver verbalized understanding and agreement of plan as discussed.  All questions were addressed during visit.  Please see discharge instructions  below for further details of plan.  Discharge Instructions:   Discharge Instructions      For the severe swelling and pain in your throat which may or may not be related to the infection in your right ear, please begin methylprednisolone 2 tablets once daily for 3 days.  For the infection in your ear, please begin cefdinir 1 capsule in the morning in the evening for the next 10 days.  Is very important that you finish the full 10-day course to be sure that the infection is completely resolved.  Please follow-up in the next 7 to 10 days if you not have meaningful relief of your symptoms.  Thank you for visiting urgent care today.      This office note has been dictated using Museum/gallery curator.  Unfortunately, and despite my best efforts, this method of dictation can sometimes lead to occasional typographical or grammatical errors.  I apologize in advance if this occurs.     Lynden Oxford Scales, PA-C 03/31/22 1310

## 2022-04-04 ENCOUNTER — Other Ambulatory Visit: Payer: Self-pay | Admitting: Obstetrics and Gynecology

## 2022-04-04 ENCOUNTER — Ambulatory Visit: Payer: PRIVATE HEALTH INSURANCE

## 2022-04-04 ENCOUNTER — Ambulatory Visit
Admission: RE | Admit: 2022-04-04 | Discharge: 2022-04-04 | Disposition: A | Discharge status: Home / Self Care | Payer: PRIVATE HEALTH INSURANCE | Source: Ambulatory Visit | Attending: Obstetrics and Gynecology | Admitting: Obstetrics and Gynecology

## 2022-04-04 DIAGNOSIS — R928 Other abnormal and inconclusive findings on diagnostic imaging of breast: Secondary | ICD-10-CM

## 2022-04-04 DIAGNOSIS — R921 Mammographic calcification found on diagnostic imaging of breast: Secondary | ICD-10-CM

## 2022-04-04 DIAGNOSIS — N6320 Unspecified lump in the left breast, unspecified quadrant: Secondary | ICD-10-CM

## 2022-07-30 ENCOUNTER — Encounter: Payer: Self-pay | Admitting: Obstetrics and Gynecology

## 2022-10-10 ENCOUNTER — Other Ambulatory Visit: Payer: PRIVATE HEALTH INSURANCE

## 2022-11-05 ENCOUNTER — Ambulatory Visit
Admission: EM | Admit: 2022-11-05 | Discharge: 2022-11-05 | Disposition: A | Discharge status: Home / Self Care | Payer: PRIVATE HEALTH INSURANCE | Attending: Internal Medicine | Admitting: Internal Medicine

## 2022-11-05 DIAGNOSIS — R509 Fever, unspecified: Secondary | ICD-10-CM

## 2022-11-05 DIAGNOSIS — J069 Acute upper respiratory infection, unspecified: Secondary | ICD-10-CM

## 2022-11-05 DIAGNOSIS — R6889 Other general symptoms and signs: Secondary | ICD-10-CM

## 2022-11-05 DIAGNOSIS — U071 COVID-19: Secondary | ICD-10-CM | POA: Insufficient documentation

## 2022-11-05 MED ORDER — ACETAMINOPHEN 325 MG PO TABS
650.0000 mg | ORAL_TABLET | Freq: Once | ORAL | Status: AC
  Administered 2022-11-05: 650 mg via ORAL

## 2022-11-05 MED ORDER — OSELTAMIVIR PHOSPHATE 75 MG PO CAPS: 75.0000 mg | ORAL_CAPSULE | Freq: Two times a day (BID) | ORAL | 0 refills | Status: DC

## 2022-11-05 MED ORDER — PROMETHAZINE-DM 6.25-15 MG/5ML PO SYRP: 5.0000 mL | ORAL_SOLUTION | Freq: Four times a day (QID) | ORAL | 0 refills | Status: DC | PRN

## 2022-11-05 NOTE — ED Triage Notes (Signed)
Pt present coughing with congestion and headache, symptoms started yesterday. Pt states coughing so hard that it hurting chest.

## 2022-11-05 NOTE — Discharge Instructions (Signed)
I am highly suspicious that you have the flu.  Therefore I am treating with Tamiflu.  I do not have any flu testing capabilities today.  I did test you for COVID.  This is pending.  Cough medication has also been prescribed.  Please be advised that this can cause drowsiness.  Do not drive or drink alcohol while taking this medication.  I recommend that you follow-up tomorrow just to have vital signs rechecked.

## 2022-11-05 NOTE — ED Provider Notes (Signed)
[name]    CSN: [id number] Arrival date & time: 11/05/22  1158      History   Chief Complaint Chief Complaint  Patient presents with   Cough   Headache    HPI Stacey Byrd is a 42 y.o. female.   Patient presents with harsh cough, nasal congestion, headache, chills that started yesterday.  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  Denies any known fevers at home.  She took NyQuil last night with minimal improvement in symptoms.  Has not had any medications today.  Reports harsh cough that is causing discomfort in her chest only when she coughs.  She denies any persistent chest pain or shortness of breath.  Denies history of asthma or COPD and patient does not smoke cigarettes.  Denies nausea, vomiting, diarrhea, abdominal pain.   Cough Headache   Past Medical History:  Diagnosis Date   Abnormal uterine bleeding 04/03/2016   DM (diabetes mellitus), type 2 ([place])    Eczema    sees dr Allyson Sabal   Head ache    Infection    UTI   Kidney stone 2013   [facility]   Normal pressure hydrocephalus ([place]) 08/2011   [facility]   Obesity, unspecified     Patient Active Problem List   Diagnosis Date Noted   History of endometrial polyp 09/29/2016   Unspecified vitamin D deficiency 02/17/2013   Pure hypercholesterolemia 02/17/2013    Past Surgical History:  Procedure Laterality Date   BREAST REDUCTION SURGERY  07/2010   REDUCTION MAMMAPLASTY Bilateral 2010    OB History     Gravida  0   Para  0   Term  0   Preterm  0   AB  0   Living  0      SAB  0   IAB  0   Ectopic  0   Multiple  0   Live Births               Home Medications    Prior to Admission medications   Medication Sig Start Date End Date Taking? Authorizing Provider  oseltamivir (TAMIFLU) 75 MG capsule Take 1 capsule (75 mg total) by mouth every 12 (twelve) hours. 11/05/22  Yes Mayla Biddy, Michele Rockers, FNP   promethazine-dextromethorphan (PROMETHAZINE-DM) 6.25-15 MG/5ML syrup Take 5 mLs by mouth every 6 (six) hours as needed for cough. 11/05/22  Yes Monae Topping, Hildred Alamin E, FNP  atorvastatin (LIPITOR) 80 MG tablet Take 1 tablet by mouth daily. 07/12/21   [provider]  clobetasol ointment (TEMOVATE) 0.05 % Apply twice a day to affected area for 2 weeks,  Then once a day for two weeks. After that apply every other day 06/10/21   [provider]  ibuprofen (ADVIL) 600 MG tablet Take 1 tablet (600 mg total) by mouth every 6 (six) hours as needed. 12/03/20   Melynda Ripple, MD  metFORMIN (GLUCOPHAGE) 500 MG tablet Take 1 tablet (500 mg total) by mouth 2 (two) times daily with a meal. 08/09/16   Kirichenko, Lahoma Rocker, PA-C  norethindrone (CAMILA) 0.35 MG tablet Take 1 tablet (0.35 mg total) by mouth daily. Take at the same time daily 03/06/22   Aletha Halim, MD  Semaglutide,0.25 or 0.'5MG'$ /DOS, (OZEMPIC, 0.25 OR 0.5 MG/DOSE,) 2 MG/1.5ML SOPN Inject into the skin. 10/07/19   [provider]  cetirizine (ZYRTEC ALLERGY) 10 MG tablet Take 1 tablet (10 mg total) by mouth daily.  09/05/20 12/03/20  Hall-Potvin, [place], PA-C    Family History Family History  Problem Relation Age of Onset   Diabetes Mother    Hypertension Mother    Gout Mother    Crohn's disease Mother    Osteoporosis Mother    Kidney disease Mother        mild, related to DM   Hyperlipidemia Mother    Stroke Father 55   Hypertension Father    Seizures Father        related to stroke   Hyperlipidemia Father    Kidney Stones Brother    Hyperlipidemia Maternal Grandmother    Cancer Neg Hx    Heart disease Neg Hx     Social History Social History   Tobacco Use   Smoking status: Never   Smokeless tobacco: Never   Tobacco comments:    quit in early 20's  Vaping Use   Vaping Use: Never used  Substance Use Topics   Alcohol use: Yes    Comment: occasional-monthly   Drug use: No     Allergies   Patient  has no known allergies.   Review of Systems Review of Systems Per HPI  Physical Exam Triage Vital Signs ED Triage Vitals  Enc Vitals Group     BP 11/05/22 1217 (!) 142/89     Pulse Rate 11/05/22 1217 (!) 119     Resp 11/05/22 1217 20     Temp 11/05/22 1217 (!) 101.6 F (38.7 C)     Temp Source 11/05/22 1217 Oral     SpO2 11/05/22 1217 98 %     Weight --      Height --      Head Circumference --      Peak Flow --      Pain Score 11/05/22 1216 8     Pain Loc --      Pain Edu? --      Excl. in [place]? --    No data found.  Updated Vital Signs BP (!) 142/89 (BP Location: Right Arm)   Pulse (!) 114   Temp 100.3 F (37.9 C) (Oral)   Resp 20   SpO2 98%   Visual Acuity Right Eye Distance:   Left Eye Distance:   Bilateral Distance:    Right Eye Near:   Left Eye Near:    Bilateral Near:     Physical Exam Constitutional:      General: She is not in acute distress.    Appearance: Normal appearance. She is not toxic-appearing or diaphoretic.  HENT:     Head: Normocephalic and atraumatic.     Right Ear: Tympanic membrane and ear canal normal.     Left Ear: Tympanic membrane and ear canal normal.     Nose: Congestion present.     Mouth/Throat:     Mouth: Mucous membranes are moist.     Pharynx: No posterior oropharyngeal erythema.  Eyes:     Extraocular Movements: Extraocular movements intact.     Conjunctiva/sclera: Conjunctivae normal.     Pupils: Pupils are equal, round, and reactive to light.  Cardiovascular:     Rate and Rhythm: Regular rhythm. Tachycardia present.     Pulses: Normal pulses.     Heart sounds: Normal heart sounds.  Pulmonary:     Effort: Pulmonary effort is normal. No respiratory distress.     Breath sounds: Normal breath sounds. No stridor. No wheezing, rhonchi or rales.  Abdominal:     General: Abdomen is  flat. Bowel sounds are normal.     Palpations: Abdomen is soft.  Musculoskeletal:        General: Normal range of motion.     Cervical  back: Normal range of motion.  Skin:    General: Skin is warm and dry.  Neurological:     General: No focal deficit present.     Mental Status: She is alert and oriented to person, place, and time. Mental status is at baseline.  Psychiatric:        Mood and Affect: Mood normal.        Behavior: Behavior normal.      UC Treatments / Results  Labs (all labs ordered are listed, but only abnormal results are displayed) Labs Reviewed  SARS CORONAVIRUS 2 (TAT 6-24 HRS)    EKG   Radiology No results found.  Procedures Procedures (including critical care time)  Medications Ordered in UC Medications  acetaminophen (TYLENOL) tablet 650 mg (650 mg Oral Given 11/05/22 1238)    Initial Impression / Assessment and Plan / UC Course  I have reviewed the triage vital signs and the nursing notes.  Pertinent labs & imaging results that were available during my care of the patient were reviewed by me and considered in my medical decision making (see chart for details).     Patient presents with symptoms likely from a viral upper respiratory infection. Differential includes bacterial pneumonia, sinusitis, allergic rhinitis, covid 19, flu, RSV. Do not suspect underlying cardiopulmonary process. Symptoms seem unlikely related to ACS, CHF or COPD exacerbations, pneumonia, pneumothorax. Patient is nontoxic appearing and not in need of emergent medical intervention.  I am highly suspicious of influenza given patient's symptoms.  Will opt to treat with Tamiflu.  Do not have flu testing capabilities here in urgent care at this time.  COVID test is pending.  Patient was tachycardic with fever.  Tylenol administered in urgent care with improvement in temp and minimal improvement in heart rate.  Although, suspect heart rate is due to fever and not a cardiac etiology.  Patient reporting chest discomfort only when she coughs so there is low concern for cardiac etiology for this as well.  There are no  adventitious lung sounds on exam so do not think that chest imaging is necessary.  Advised patient to start medication and to push fluids and to follow-up tomorrow to have recheck of vital signs.  She was agreeable. Patient is safe for discharge.   Recommended symptom control with medications and supportive care.  Tamiflu sent for patient.  Promethazine DM also sent for patient.  Advised patient this can cause drowsiness.  Fever monitoring and management discussed with patient.  Patient states understanding and is agreeable.  Final Clinical Impressions(s) / UC Diagnoses   Final diagnoses:  Viral upper respiratory tract infection with cough  Flu-like symptoms  Fever, unspecified     Discharge Instructions      I am highly suspicious that you have the flu.  Therefore I am treating with Tamiflu.  I do not have any flu testing capabilities today.  I did test you for COVID.  This is pending.  Cough medication has also been prescribed.  Please be advised that this can cause drowsiness.  Do not drive or drink alcohol while taking this medication.  I recommend that you follow-up tomorrow just to have vital signs rechecked.     ED Prescriptions     Medication Sig Dispense Auth. Provider   oseltamivir (TAMIFLU) 75 MG  capsule Take 1 capsule (75 mg total) by mouth every 12 (twelve) hours. 10 capsule Oswaldo Conroy E, [place]   promethazine-dextromethorphan (PROMETHAZINE-DM) 6.25-15 MG/5ML syrup Take 5 mLs by mouth every 6 (six) hours as needed for cough. 118 mL Teodora Medici, [place]      PDMP not reviewed this encounter.   Teodora Medici,  11/05/22 1321

## 2022-11-06 ENCOUNTER — Ambulatory Visit: Payer: Self-pay

## 2022-11-06 LAB — SARS CORONAVIRUS 2 (TAT 6-24 HRS): SARS Coronavirus 2: POSITIVE — AB

## 2022-11-07 ENCOUNTER — Other Ambulatory Visit: Payer: PRIVATE HEALTH INSURANCE

## 2023-01-06 ENCOUNTER — Encounter: Payer: Self-pay | Admitting: Obstetrics and Gynecology

## 2023-01-16 ENCOUNTER — Ambulatory Visit
Admission: RE | Admit: 2023-01-16 | Discharge: 2023-01-16 | Disposition: A | Discharge status: Home / Self Care | Payer: Self-pay | Source: Ambulatory Visit | Attending: Obstetrics and Gynecology | Admitting: Obstetrics and Gynecology

## 2023-01-16 ENCOUNTER — Other Ambulatory Visit: Payer: Self-pay | Admitting: Obstetrics and Gynecology

## 2023-01-16 ENCOUNTER — Ambulatory Visit
Admission: RE | Admit: 2023-01-16 | Discharge: 2023-01-16 | Disposition: A | Discharge status: Home / Self Care | Payer: No Typology Code available for payment source | Source: Ambulatory Visit | Attending: Obstetrics and Gynecology

## 2023-01-16 DIAGNOSIS — N6320 Unspecified lump in the left breast, unspecified quadrant: Secondary | ICD-10-CM

## 2023-01-16 DIAGNOSIS — R921 Mammographic calcification found on diagnostic imaging of breast: Secondary | ICD-10-CM

## 2023-01-19 ENCOUNTER — Other Ambulatory Visit: Payer: Self-pay

## 2023-01-19 ENCOUNTER — Encounter (HOSPITAL_BASED_OUTPATIENT_CLINIC_OR_DEPARTMENT_OTHER): Payer: Self-pay | Admitting: Emergency Medicine

## 2023-01-19 ENCOUNTER — Emergency Department (HOSPITAL_BASED_OUTPATIENT_CLINIC_OR_DEPARTMENT_OTHER): Payer: No Typology Code available for payment source

## 2023-01-19 ENCOUNTER — Emergency Department (HOSPITAL_BASED_OUTPATIENT_CLINIC_OR_DEPARTMENT_OTHER)
Admission: EM | Admit: 2023-01-19 | Discharge: 2023-01-19 | Disposition: A | Discharge status: Home / Self Care | Payer: No Typology Code available for payment source | Attending: Emergency Medicine | Admitting: Emergency Medicine

## 2023-01-19 DIAGNOSIS — Z7984 Long term (current) use of oral hypoglycemic drugs: Secondary | ICD-10-CM | POA: Diagnosis not present

## 2023-01-19 DIAGNOSIS — E119 Type 2 diabetes mellitus without complications: Secondary | ICD-10-CM | POA: Insufficient documentation

## 2023-01-19 DIAGNOSIS — W208XXA Other cause of strike by thrown, projected or falling object, initial encounter: Secondary | ICD-10-CM | POA: Insufficient documentation

## 2023-01-19 DIAGNOSIS — S99921A Unspecified injury of right foot, initial encounter: Secondary | ICD-10-CM | POA: Diagnosis not present

## 2023-01-19 HISTORY — DX: Pure hypercholesterolemia, unspecified: E78.00

## 2023-01-19 NOTE — ED Provider Notes (Signed)
[facility] Provider Note   CSN: [id number] Arrival date & time: 01/19/23  1742     History  Chief Complaint  Patient presents with   Foot Injury    Stacey Byrd is a 42 y.o. female with history of eczema, normal pressure hydrocephalus, abnormal uterine bleeding, type 2 diabetes, and hyperlipidemia who presents the emergency department complaining of a right foot injury.  Patient states that a bookcase fell onto the top of her right foot.  This happened yesterday.  She is complaining of increased pain and swelling.  She is having difficulty bearing weight because of pain.   Foot Injury      Home Medications Prior to Admission medications   Medication Sig Start Date End Date Taking? Authorizing Provider  atorvastatin (LIPITOR) 80 MG tablet Take 1 tablet by mouth daily. 07/12/21   [provider]  clobetasol ointment (TEMOVATE) 0.05 % Apply twice a day to affected area for 2 weeks,  Then once a day for two weeks. After that apply every other day 06/10/21   [provider]  ibuprofen (ADVIL) 600 MG tablet Take 1 tablet (600 mg total) by mouth every 6 (six) hours as needed. 12/03/20   Melynda Ripple, MD  metFORMIN (GLUCOPHAGE) 500 MG tablet Take 1 tablet (500 mg total) by mouth 2 (two) times daily with a meal. 08/09/16   Kirichenko, Lahoma Rocker, PA-C  norethindrone (CAMILA) 0.35 MG tablet Take 1 tablet (0.35 mg total) by mouth daily. Take at the same time daily 03/06/22   Aletha Halim, MD  oseltamivir (TAMIFLU) 75 MG capsule Take 1 capsule (75 mg total) by mouth every 12 (twelve) hours. 11/05/22   Teodora Medici, FNP  promethazine-dextromethorphan (PROMETHAZINE-DM) 6.25-15 MG/5ML syrup Take 5 mLs by mouth every 6 (six) hours as needed for cough. 11/05/22   Teodora Medici, FNP  Semaglutide,0.25 or 0.5MG /DOS, (OZEMPIC, 0.25 OR 0.5 MG/DOSE,) 2 MG/1.5ML SOPN Inject into the skin. 10/07/19   [provider]  cetirizine  (ZYRTEC ALLERGY) 10 MG tablet Take 1 tablet (10 mg total) by mouth daily. 09/05/20 12/03/20  Hall-Potvin, [place], PA-C      Allergies    Patient has no known allergies.    Review of Systems   Review of Systems  Musculoskeletal:  Positive for arthralgias.  All other systems reviewed and are negative.   Physical Exam Updated Vital Signs BP (!) 141/81   Pulse 96   Temp 98.5 F (36.9 C)   Resp 20   Ht 5\' 4"  (1.626 m)   Wt 107.5 kg   LMP 01/12/2023   SpO2 95%   BMI 40.68 kg/m  Physical Exam Vitals and nursing note reviewed.  Constitutional:      Appearance: Normal appearance.  HENT:     Head: Normocephalic and atraumatic.  Eyes:     Conjunctiva/sclera: Conjunctivae normal.  Pulmonary:     Effort: Pulmonary effort is normal. No respiratory distress.  Feet:     Comments: Swelling noted to the dorsum of the right foot, with generalized tenderness to palpation.  No focal tenderness or bony abnormalities.  Able to wiggle the toes and range the ankle. Skin:    General: Skin is warm and dry.     Capillary Refill: Capillary refill takes less than 2 seconds.  Neurological:     Mental Status: She is alert.  Psychiatric:        Mood and Affect: Mood normal.        Behavior:  Behavior normal.     ED Results / Procedures / Treatments   Labs (all labs ordered are listed, but only abnormal results are displayed) Labs Reviewed - No data to display  EKG None  Radiology DG Foot Complete Right  Result Date: 01/19/2023 CLINICAL DATA:  Right foot injury. Bookcase fell onto the right foot. Dorsal pain and swelling. EXAM: RIGHT FOOT COMPLETE - 3+ VIEW COMPARISON:  None Available. FINDINGS: No fracture or bone lesion. Developmentally short fourth metatarsal. Joints normally spaced and aligned. Soft tissue swelling most evident dorsally. IMPRESSION: 1. No fracture or dislocation. Electronically Signed   By: Lajean Manes M.D.   On: 01/19/2023 18:43    Procedures Procedures     Medications Ordered in ED Medications - No data to display  ED Course/ Medical Decision Making/ A&P                             Medical Decision Making Amount and/or Complexity of Data Reviewed Radiology: ordered.  This patient is a 42 y.o. female  who presents to the ED for concern of right foot injury yesterday.   Differential diagnoses prior to evaluation: The emergent differential diagnosis includes, but is not limited to,  fracture, dislocation, ligamentous injury. This is not an exhaustive differential.   Past Medical History / Co-morbidities: eczema, normal pressure hydrocephalus, abnormal uterine bleeding, type 2 diabetes, and hyperlipidemia  Physical Exam: Physical exam performed. The pertinent findings include: Swelling noted to the dorsum of the foot, able to wiggle the toes and range the ankle, no bony abnormalities  Lab Tests/Imaging studies: I personally interpreted labs/imaging and the pertinent results include:  x-ray of right foot with no fractures or dislocations. I agree with the radiologist interpretation.  Disposition: After consideration of the diagnostic results and the patients response to treatment, I feel that emergency department workup does not suggest an emergent condition requiring admission or immediate intervention beyond what has been performed at this time. The plan is: discharge to home with symptomatic management of likely right foot sprain. Will provide with ace wrap and crutches. X-ray reassuring. Will provide ortho follow up if symptoms persist. The patient is safe for discharge and has been instructed to return immediately for worsening symptoms, change in symptoms or any other concerns.  Final Clinical Impression(s) / ED Diagnoses Final diagnoses:  Injury of right foot, initial encounter    Rx / DC Orders ED Discharge Orders     None      Portions of this report may have been transcribed using voice recognition software. Every  effort was made to ensure accuracy; however, inadvertent computerized transcription errors may be present.    Estill Cotta 01/19/23 2312    Davonna Belling, MD 01/19/23 [phone]

## 2023-01-19 NOTE — ED Triage Notes (Signed)
Pt arrives pov, slow gait with c/o RT foot injury, reports bookcase fell onto foot. Pt reports dorsal pain and swelling

## 2023-01-19 NOTE — Discharge Instructions (Addendum)
You are seen emergency department today for right foot injury.  As we discussed your x-ray was negative for any fractures or dislocations.  We have wrapped this for you and given you crutches.  I recommend taking ibuprofen and/or Tylenol as needed for pain.  You can use ice for swelling, and elevate the extremity when possible.  I have attached the contact information for the orthopedist for you to follow-up with if your symptoms persist.

## 2023-05-11 ENCOUNTER — Ambulatory Visit
Admission: EM | Admit: 2023-05-11 | Discharge: 2023-05-11 | Disposition: A | Discharge status: Home / Self Care | Payer: No Typology Code available for payment source | Attending: Internal Medicine | Admitting: Internal Medicine

## 2023-05-11 DIAGNOSIS — N764 Abscess of vulva: Secondary | ICD-10-CM

## 2023-05-11 MED ORDER — DOXYCYCLINE HYCLATE 100 MG PO CAPS: 100.0000 mg | ORAL_CAPSULE | Freq: Two times a day (BID) | ORAL | 0 refills | Status: DC

## 2023-05-11 NOTE — ED Provider Notes (Signed)
[name]    CSN: [id number] Arrival date & time: 05/11/23  1738      History   Chief Complaint No chief complaint on file.   HPI Stacey Byrd is a 42 y.o. female.   Patient presents with possible abscess to left labia that started about 4 days ago.  Patient reports that she intermittently gets these every few months that typically resolve on their own.  This one is more painful and persistent.  Denies any drainage from the area or any associated fever.     Past Medical History:  Diagnosis Date   Abnormal uterine bleeding 04/03/2016   DM (diabetes mellitus), type 2 ([facility])    Eczema    sees dr Terri Piedra   Head ache    High cholesterol    Infection    UTI   Kidney stone 2013   [facility]   Normal pressure hydrocephalus ([facility]) 08/2011   [facility]   Obesity, unspecified     Patient Active Problem List   Diagnosis Date Noted   History of endometrial polyp 09/29/2016   Unspecified vitamin D deficiency 02/17/2013   Pure hypercholesterolemia 02/17/2013    Past Surgical History:  Procedure Laterality Date   BREAST REDUCTION SURGERY  07/2010   REDUCTION MAMMAPLASTY Bilateral 2010    OB History     Gravida  0   Para  0   Term  0   Preterm  0   AB  0   Living  0      SAB  0   IAB  0   Ectopic  0   Multiple  0   Live Births               Home Medications    Prior to Admission medications   Medication Sig Start Date End Date Taking? Authorizing Provider  doxycycline (VIBRAMYCIN) 100 MG capsule Take 1 capsule (100 mg total) by mouth 2 (two) times daily. 05/11/23  Yes Labaron Digirolamo, Rolly Salter E, FNP  atorvastatin (LIPITOR) 80 MG tablet Take 1 tablet by mouth daily. 07/12/21   [provider]  clobetasol ointment (TEMOVATE) 0.05 % Apply twice a day to affected area for 2 weeks,  Then once a day for two weeks. After that apply every other day 06/10/21   [provider]  ibuprofen (ADVIL) 600 MG tablet Take 1 tablet (600 mg  total) by mouth every 6 (six) hours as needed. 12/03/20   Domenick Gong, MD  metFORMIN (GLUCOPHAGE) 500 MG tablet Take 1 tablet (500 mg total) by mouth 2 (two) times daily with a meal. 08/09/16   Kirichenko, Lemont Fillers, PA-C  norethindrone (CAMILA) 0.35 MG tablet Take 1 tablet (0.35 mg total) by mouth daily. Take at the same time daily 03/06/22   Hytop Bing, MD  oseltamivir (TAMIFLU) 75 MG capsule Take 1 capsule (75 mg total) by mouth every 12 (twelve) hours. 11/05/22   Gustavus Bryant, FNP  promethazine-dextromethorphan (PROMETHAZINE-DM) 6.25-15 MG/5ML syrup Take 5 mLs by mouth every 6 (six) hours as needed for cough. 11/05/22   Gustavus Bryant, FNP  Semaglutide,0.25 or 0.5MG /DOS, (OZEMPIC, 0.25 OR 0.5 MG/DOSE,) 2 MG/1.5ML SOPN Inject into the skin. 10/07/19   [provider]  cetirizine (ZYRTEC ALLERGY) 10 MG tablet Take 1 tablet (10 mg total) by mouth daily. 09/05/20 12/03/20  Hall-Potvin, [place], PA-C    Family History Family History  Problem Relation Age of Onset   Diabetes Mother    Hypertension Mother  Gout Mother    Crohn's disease Mother    Osteoporosis Mother    Kidney disease Mother        mild, related to DM   Hyperlipidemia Mother    Stroke Father 63   Hypertension Father    Seizures Father        related to stroke   Hyperlipidemia Father    Kidney Stones Brother    Hyperlipidemia Maternal Grandmother    Cancer Neg Hx    Heart disease Neg Hx     Social History Social History   Tobacco Use   Smoking status: Never   Smokeless tobacco: Never   Tobacco comments:    quit in early 20's  Vaping Use   Vaping status: Never Used  Substance Use Topics   Alcohol use: Yes    Comment: occasional-monthly   Drug use: No     Allergies   Patient has no known allergies.   Review of Systems Review of Systems Per HPI  Physical Exam Triage Vital Signs ED Triage Vitals  Encounter Vitals Group     BP 05/11/23 1752 (!) 146/81     Systolic BP Percentile --       Diastolic BP Percentile --      Pulse Rate 05/11/23 1752 (!) 110     Resp 05/11/23 1752 15     Temp 05/11/23 1752 98.1 F (36.7 C)     Temp Source 05/11/23 1752 Oral     SpO2 05/11/23 1752 97 %     Weight --      Height --      Head Circumference --      Peak Flow --      Pain Score 05/11/23 1756 5     Pain Loc --      Pain Education --      Exclude from Growth Chart --    No data found.  Updated Vital Signs BP (!) 146/81 (BP Location: Right Arm)   Pulse (!) 110   Temp 98.1 F (36.7 C) (Oral)   Resp 15   LMP 04/29/2023 (Within Days)   SpO2 97%   Visual Acuity Right Eye Distance:   Left Eye Distance:   Bilateral Distance:    Right Eye Near:   Left Eye Near:    Bilateral Near:     Physical Exam Exam conducted with a chaperone present.  Constitutional:      General: She is not in acute distress.    Appearance: Normal appearance. She is not toxic-appearing or diaphoretic.  HENT:     Head: Normocephalic and atraumatic.  Eyes:     Extraocular Movements: Extraocular movements intact.     Conjunctiva/sclera: Conjunctivae normal.  Pulmonary:     Effort: Pulmonary effort is normal.  Genitourinary:    Comments: Patient has a small area of induration that is approximately 1 cm in diameter present to left lower outer labia with no drainage noted. Neurological:     General: No focal deficit present.     Mental Status: She is alert and oriented to person, place, and time. Mental status is at baseline.  Psychiatric:        Mood and Affect: Mood normal.        Behavior: Behavior normal.        Thought Content: Thought content normal.        Judgment: Judgment normal.      UC Treatments / Results  Labs (all labs ordered are listed, but  only abnormal results are displayed) Labs Reviewed - No data to display  EKG   Radiology No results found.  Procedures Procedures (including critical care time)  Medications Ordered in UC Medications - No data to  display  Initial Impression / Assessment and Plan / UC Course  I have reviewed the triage vital signs and the nursing notes.  Pertinent labs & imaging results that were available during my care of the patient were reviewed by me and considered in my medical decision making (see chart for details).     Patient has very small labial abscess present.  No I&D indicated at this time.  Will treat with doxycycline. Advised warm compresses as well. Advised her to follow-up with dermatology or PCP if symptoms persist or worsen.  Patient verbalized understanding and was agreeable with plan. Final Clinical Impressions(s) / UC Diagnoses   Final diagnoses:  Labial abscess     Discharge Instructions      I have prescribed an antibiotic.  Use warm compresses as we discussed.  Follow-up if symptoms persist or worsen.    ED Prescriptions     Medication Sig Dispense Auth. Provider   doxycycline (VIBRAMYCIN) 100 MG capsule Take 1 capsule (100 mg total) by mouth 2 (two) times daily. 20 capsule Gustavus Bryant, [place]      PDMP not reviewed this encounter.   Gustavus Bryant, [place] 05/11/23 [phone]

## 2023-05-11 NOTE — ED Triage Notes (Signed)
Pt c/o boil on her labia, pt states she has been getting repeatedly, normally they don't hurt but they hurt today x 4 days

## 2023-05-11 NOTE — Discharge Instructions (Signed)
I have prescribed an antibiotic.  Use warm compresses as we discussed.  Follow-up if symptoms persist or worsen.

## 2023-07-24 ENCOUNTER — Other Ambulatory Visit: Payer: No Typology Code available for payment source

## 2023-08-14 ENCOUNTER — Ambulatory Visit
Admission: RE | Admit: 2023-08-14 | Discharge: 2023-08-14 | Disposition: A | Discharge status: Home / Self Care | Payer: Medicaid Other | Source: Ambulatory Visit | Attending: Obstetrics and Gynecology

## 2023-08-14 DIAGNOSIS — N6320 Unspecified lump in the left breast, unspecified quadrant: Secondary | ICD-10-CM

## 2023-10-08 ENCOUNTER — Encounter: Payer: Self-pay | Admitting: Obstetrics and Gynecology

## 2023-10-08 ENCOUNTER — Ambulatory Visit: Payer: Medicaid Other | Admitting: Obstetrics and Gynecology

## 2023-10-08 ENCOUNTER — Other Ambulatory Visit: Payer: Self-pay

## 2023-10-08 VITALS — BP 132/84 | HR 88 | Wt 238.4 lb

## 2023-10-08 DIAGNOSIS — Z1331 Encounter for screening for depression: Secondary | ICD-10-CM | POA: Diagnosis not present

## 2023-10-08 DIAGNOSIS — N9089 Other specified noninflammatory disorders of vulva and perineum: Secondary | ICD-10-CM | POA: Diagnosis not present

## 2023-10-08 DIAGNOSIS — Z01419 Encounter for gynecological examination (general) (routine) without abnormal findings: Secondary | ICD-10-CM | POA: Diagnosis not present

## 2023-10-08 NOTE — Progress Notes (Signed)
ANNUAL EXAM Patient name: Stacey Byrd MRN [id number]  Date of birth: [birth year] Chief Complaint:   Gynecologic Exam  History of Present Illness:   Stacey Byrd is a 42 y.o. G0P0000 African-American female being seen today for a routine annual exam.  Current complaints: None  Patient's last menstrual period was 09/16/2023 (approximate).   The pregnancy intention screening data noted above was reviewed. Potential methods of contraception were discussed. The patient elected to proceed with No data recorded.   Last pap 03/06/2022. Results were: NILM w/ HRHPV negative. H/O abnormal pap: no Last mammogram: 08/14/23. Results were: abnormal Benign right breast calcification, benign left breast mass without growth; f/u in 1 year . Family h/o breast cancer: no Last colonoscopy: Not indicated. Results were: N/A. Family h/o colorectal cancer: no Abnormal Uterine bleeding: Stopped taking Camila one year ago, periods are regular and tolerable in volume.  Labial bump: Noticed last summer, sensitive, grows and shrinks in size. No discharge comes from it. No itching, burning.  STI testing: declines     12/12/2020    8:14 AM 09/09/2019    9:08 AM 07/26/2018    8:57 AM 09/29/2016   12:53 PM 09/29/2016    8:44 AM  Depression screen PHQ 2/9  Decreased Interest 1 0 0    Down, Depressed, Hopeless 1 1 0 1 1  PHQ - 2 Score 2 1 0 1 1  Altered sleeping 0 0 0    Tired, decreased energy 1 1 1 1 1   Change in appetite 0 0 0    Feeling bad or failure about yourself  0 0 0    Trouble concentrating 0 0 0    Moving slowly or fidgety/restless 0 0 0    Suicidal thoughts 0 0 0    PHQ-9 Score 3 2 1           12/12/2020    8:15 AM 09/09/2019    9:08 AM 07/26/2018    8:57 AM 09/29/2016   12:53 PM  GAD 7 : Generalized Anxiety Score  Nervous, Anxious, on Edge 0 1 0   Control/stop worrying 0 0 0 1  Worry too much - different things 0 1 0 1  Trouble relaxing 0 0 0   Restless 0 0 0   Easily annoyed or  irritable 0 1 0   Afraid - awful might happen 0 0 0   Total GAD 7 Score 0 3 0      Review of Systems:   Pertinent items are noted in HPI Denies any headaches, blurred vision, fatigue, shortness of breath, chest pain, abdominal pain, abnormal vaginal discharge/itching/odor/irritation, problems with periods, bowel movements, urination, or intercourse unless otherwise stated above. Pertinent History Reviewed:  Reviewed past medical,surgical, social and family history.  Reviewed problem list, medications and allergies. Physical Assessment:   Vitals:   10/08/23 0838  BP: 132/84  Pulse: 88  Weight: 238 lb 6.4 oz (108.1 kg)  Body mass index is 40.92 kg/m.        Physical Examination:   General appearance - well appearing, and in no distress  Mental status - alert, oriented to person, place, and time  Psych:  She has a normal mood and affect  Skin - warm and dry, normal color, no suspicious lesions noted  Chest - effort normal, all lung fields clear to auscultation bilaterally  Heart - normal rate and regular rhythm  Neck:  midline trachea, no thyromegaly or nodules  Breasts - breasts appear normal,  no suspicious masses, no skin or nipple changes or  axillary nodes  Abdomen - soft, nontender, nondistended, no masses or organomegaly  Pelvic - VULVA: normal appearing vulva with no masses, tenderness or lesions.   Extremities:  No swelling or varicosities noted  Chaperone present for exam  No results found for this or any previous visit (from the past 24 hours).  Assessment & Plan:  1. Encounter for annual routine gynecological examination (Primary) - Last pap 03/06/2022 NILM/HPV neg, repeat 2028 - Last MMG 08/14/23 with stable R breast calcification and benign L breast mass. Repeat 07/2024 - Plan for colonoscopy at age 30 - Has not taken Camila in ~1 year for abnormal uterine bleeding. Now having regular periods each month with manageable volume.  - Had routine screening labs  performed by PCP at [facility] one month ago. - MM 3D SCREENING MAMMOGRAM BILATERAL BREAST; Future    2. Lesion of labia Patient with history of solitary lesion on medial aspect of L labia majora of fluctuating size with accompanying tenderness. No lesion or concerning features present on exam today. Ddx includes folliculitis, cyst.  - Patient declines STI testing today - Advised to return for examination if lesion recurs.    Orders Placed This Encounter  Procedures   MM 3D SCREENING MAMMOGRAM BILATERAL BREAST    Meds: No orders of the defined types were placed in this encounter.   Follow-up: Return in about 1 year (around 10/07/2024), or or sooner as needed, for ANN.  Ralene Muskrat, PA-C 10/08/2023 9:25 AM

## 2023-11-27 ENCOUNTER — Telehealth: Payer: Medicaid Other | Admitting: Physician Assistant

## 2023-11-27 DIAGNOSIS — H109 Unspecified conjunctivitis: Secondary | ICD-10-CM | POA: Diagnosis not present

## 2023-11-27 MED ORDER — OFLOXACIN 0.3 % OP SOLN: 1.0000 [drp] | Freq: Four times a day (QID) | OPHTHALMIC | 0 refills | Status: DC

## 2023-11-27 NOTE — Progress Notes (Signed)

## 2023-12-28 ENCOUNTER — Ambulatory Visit
Admission: EM | Admit: 2023-12-28 | Discharge: 2023-12-28 | Disposition: A | Discharge status: Home / Self Care | Attending: Physician Assistant | Admitting: Physician Assistant

## 2023-12-28 DIAGNOSIS — Z7984 Long term (current) use of oral hypoglycemic drugs: Secondary | ICD-10-CM | POA: Diagnosis not present

## 2023-12-28 DIAGNOSIS — R21 Rash and other nonspecific skin eruption: Secondary | ICD-10-CM | POA: Diagnosis not present

## 2023-12-28 DIAGNOSIS — E119 Type 2 diabetes mellitus without complications: Secondary | ICD-10-CM

## 2023-12-28 DIAGNOSIS — M545 Low back pain, unspecified: Secondary | ICD-10-CM | POA: Diagnosis not present

## 2023-12-28 MED ORDER — PREDNISONE 20 MG PO TABS: 40.0000 mg | ORAL_TABLET | Freq: Every day | ORAL | 0 refills | Status: AC

## 2023-12-28 MED ORDER — CYCLOBENZAPRINE HCL 10 MG PO TABS: 10.0000 mg | ORAL_TABLET | Freq: Two times a day (BID) | ORAL | 0 refills | Status: DC | PRN

## 2023-12-28 NOTE — ED Triage Notes (Signed)
"  I have a tightness in my back (pulling feeling), no previous injury, located around pant line on lower left side, no dysuria, no urinary problems".   Also, "have a rash on my left arm".

## 2023-12-28 NOTE — ED Provider Notes (Signed)
 [name]    CSN: [id number] Arrival date & time: 12/28/23  0834      History   Chief Complaint Chief Complaint  Patient presents with   Back Pain   Rash    HPI Stacey Byrd is a 43 y.o. female.   Patient here today for evaluation of tightness to her left low back that is been present for the last several days.  She reports she has not had any urinary issues or dysuria.  She also notes a rash on her left arm that is been there for about a week.  She reports rash is itchy.  She does not report any fever.  She is unsure of any known exposure that might of caused rash.  He has not had any loss of bowel or bladder function.  She denies any numbness or tingling.  The history is provided by the patient.  Back Pain Associated symptoms: no abdominal pain, no fever and no numbness   Rash Associated symptoms: no abdominal pain, no fever, no nausea, no shortness of breath and not vomiting     Past Medical History:  Diagnosis Date   Abnormal uterine bleeding 04/03/2016   DM (diabetes mellitus), type 2 (HCC)    Eczema    sees dr Terri Piedra   Head ache    High cholesterol    Infection    UTI   Kidney stone 2013   [facility]   Normal pressure hydrocephalus (HCC) 08/2011   [facility]   Obesity, unspecified     Patient Active Problem List   Diagnosis Date Noted   Mixed hyperlipidemia 07/08/2019   Type 2 diabetes mellitus (HCC) 07/08/2019   History of endometrial polyp 09/29/2016   Vitamin D deficiency 02/17/2013   Pure hypercholesterolemia 02/17/2013    Past Surgical History:  Procedure Laterality Date   BREAST REDUCTION SURGERY  07/2010   REDUCTION MAMMAPLASTY Bilateral 2010    OB History     Gravida  0   Para  0   Term  0   Preterm  0   AB  0   Living  0      SAB  0   IAB  0   Ectopic  0   Multiple  0   Live Births               Home Medications    Prior to Admission medications   Medication Sig Start Date End Date  Taking? Authorizing Provider  atorvastatin (LIPITOR) 80 MG tablet Take 1 tablet by mouth daily. 07/12/21  Yes [provider]  cyclobenzaprine (FLEXERIL) 10 MG tablet Take 1 tablet (10 mg total) by mouth 2 (two) times daily as needed for muscle spasms. 12/28/23  Yes Tomi Bamberger, PA-C  ibuprofen (ADVIL) 600 MG tablet Take 1 tablet (600 mg total) by mouth every 6 (six) hours as needed. 12/03/20  Yes Domenick Gong, MD  metFORMIN (GLUCOPHAGE-XR) 500 MG 24 hr tablet Take 500 mg by mouth 2 (two) times daily with a meal. 11/07/22  Yes [provider]  predniSONE (DELTASONE) 20 MG tablet Take 2 tablets (40 mg total) by mouth daily with breakfast for 5 days. 12/28/23 01/02/24 Yes Tomi Bamberger, PA-C  Vitamin D, Ergocalciferol, (DRISDOL) 1.25 MG (50000 UNIT) CAPS capsule Take 1 capsule by mouth once a week. 09/22/23  Yes [provider]  Continuous Glucose Sensor (DEXCOM G7 SENSOR) MISC 1 each by miscellaneous route every 10 days. 08/05/23  [provider]  fluconazole (DIFLUCAN) 150 MG tablet Take 150 mg by mouth once. Patient not taking: Reported on 10/08/2023 06/30/23   [provider]  ofloxacin (OCUFLOX) 0.3 % ophthalmic solution Place 1 drop into the right eye 4 (four) times daily. For 5 days 11/27/23   Margaretann Loveless, PA-C  pioglitazone (ACTOS) 15 MG tablet Take 1 tablet by mouth daily. 08/03/23 08/02/24  [provider]  triamcinolone cream (KENALOG) 0.1 % Apply topically. 09/04/23   [provider]  cetirizine (ZYRTEC ALLERGY) 10 MG tablet Take 1 tablet (10 mg total) by mouth daily. 09/05/20 12/03/20  Hall-Potvin, [place], PA-C    Family History Family History  Problem Relation Age of Onset   Diabetes Mother    Hypertension Mother    Gout Mother    Crohn's disease Mother    Osteoporosis Mother    Kidney disease Mother        mild, related to DM   Hyperlipidemia Mother    Stroke Father 38   Hypertension Father     Seizures Father        related to stroke   Hyperlipidemia Father    Kidney Stones Brother    Hyperlipidemia Maternal Grandmother    Cancer Neg Hx    Heart disease Neg Hx     Social History Social History   Tobacco Use   Smoking status: Never   Smokeless tobacco: Never   Tobacco comments:    quit in early 20's  Vaping Use   Vaping status: Never Used  Substance Use Topics   Alcohol use: Yes    Comment: occasional-monthly   Drug use: No     Allergies   Patient has no known allergies.   Review of Systems Review of Systems  Constitutional:  Negative for chills and fever.  Eyes:  Negative for discharge and redness.  Respiratory:  Negative for shortness of breath.   Gastrointestinal:  Negative for abdominal pain, nausea and vomiting.  Musculoskeletal:  Positive for back pain.  Skin:  Positive for rash.  Neurological:  Negative for numbness.     Physical Exam Triage Vital Signs ED Triage Vitals  Encounter Vitals Group     BP 12/28/23 0930 121/79     Systolic BP Percentile --      Diastolic BP Percentile --      Pulse Rate 12/28/23 0930 86     Resp 12/28/23 0930 18     Temp 12/28/23 0930 98 F (36.7 C)     Temp Source 12/28/23 0930 Oral     SpO2 12/28/23 0930 99 %     Weight 12/28/23 0927 234 lb (106.1 kg)     Height 12/28/23 0927 5\' 5"  (1.651 m)     Head Circumference --      Peak Flow --      Pain Score 12/28/23 0924 8     Pain Loc --      Pain Education --      Exclude from Growth Chart --    No data found.  Updated Vital Signs BP 121/79 (BP Location: Left Arm)   Pulse 86   Temp 98 F (36.7 C) (Oral)   Resp 18   Ht 5\' 5"  (1.651 m)   Wt 234 lb (106.1 kg)   LMP 11/30/2023 (Approximate)   SpO2 99%   BMI 38.94 kg/m   Visual Acuity Right Eye Distance:   Left Eye Distance:   Bilateral Distance:    Right Eye Near:  Left Eye Near:    Bilateral Near:     Physical Exam Vitals and nursing note reviewed.  Constitutional:      General: She is  not in acute distress.    Appearance: Normal appearance. She is not ill-appearing.  HENT:     Head: Normocephalic and atraumatic.  Eyes:     Conjunctiva/sclera: Conjunctivae normal.  Cardiovascular:     Rate and Rhythm: Normal rate.  Pulmonary:     Effort: Pulmonary effort is normal. No respiratory distress.  Musculoskeletal:     Comments: No tenderness palpation of midline thoracic or lumbar spine.  Mild tenderness noted to left low back.  Skin:    Comments: Scattered papular lesions to left forearm  Neurological:     Mental Status: She is alert.  Psychiatric:        Mood and Affect: Mood normal.        Behavior: Behavior normal.        Thought Content: Thought content normal.      UC Treatments / Results  Labs (all labs ordered are listed, but only abnormal results are displayed) Labs Reviewed - No data to display  EKG   Radiology No results found.  Procedures Procedures (including critical care time)  Medications Ordered in UC Medications - No data to display  Initial Impression / Assessment and Plan / UC Course  I have reviewed the triage vital signs and the nursing notes.  Pertinent labs & imaging results that were available during my care of the patient were reviewed by me and considered in my medical decision making (see chart for details).    Discussed likely muscle strain as cause of back pain.  Will treat with steroid burst and muscle laxer.  Advised that steroids may elevate glucose levels and patient reports that her last A1c showed well-controlled diabetes.  Discussed the muscle relaxer may cause drowsiness and use with caution.  Hopefully steroid will also help improve rash but encouraged follow-up if any symptoms do not improve or with any further concerns.  Final Clinical Impressions(s) / UC Diagnoses   Final diagnoses:  Rash and nonspecific skin eruption  Acute left-sided low back pain without sciatica  Type 2 diabetes mellitus without  complication, without long-term current use of insulin [facility])   Discharge Instructions   None    ED Prescriptions     Medication Sig Dispense Auth. Provider   predniSONE (DELTASONE) 20 MG tablet Take 2 tablets (40 mg total) by mouth daily with breakfast for 5 days. 10 tablet Erma Pinto F, PA-C   cyclobenzaprine (FLEXERIL) 10 MG tablet Take 1 tablet (10 mg total) by mouth 2 (two) times daily as needed for muscle spasms. 20 tablet Tomi Bamberger, PA-C      PDMP not reviewed this encounter.   Tomi Bamberger, PA-C 12/28/23 1239

## 2024-01-04 ENCOUNTER — Encounter: Payer: Self-pay | Admitting: Obstetrics and Gynecology

## 2024-01-04 DIAGNOSIS — E559 Vitamin D deficiency, unspecified: Secondary | ICD-10-CM | POA: Diagnosis not present

## 2024-01-04 DIAGNOSIS — E7800 Pure hypercholesterolemia, unspecified: Secondary | ICD-10-CM | POA: Diagnosis not present

## 2024-01-04 DIAGNOSIS — L309 Dermatitis, unspecified: Secondary | ICD-10-CM | POA: Diagnosis not present

## 2024-01-04 DIAGNOSIS — R5383 Other fatigue: Secondary | ICD-10-CM | POA: Diagnosis not present

## 2024-01-04 DIAGNOSIS — Z3200 Encounter for pregnancy test, result unknown: Secondary | ICD-10-CM | POA: Diagnosis not present

## 2024-01-04 DIAGNOSIS — E118 Type 2 diabetes mellitus with unspecified complications: Secondary | ICD-10-CM | POA: Diagnosis not present

## 2024-01-04 NOTE — Telephone Encounter (Signed)
 Patient has an appointment scheduled for 01/05/24  [facility]

## 2024-01-05 ENCOUNTER — Encounter: Payer: Self-pay | Admitting: Obstetrics and Gynecology

## 2024-01-05 ENCOUNTER — Ambulatory Visit (INDEPENDENT_AMBULATORY_CARE_PROVIDER_SITE_OTHER): Admitting: Obstetrics and Gynecology

## 2024-01-05 ENCOUNTER — Other Ambulatory Visit: Payer: Self-pay

## 2024-01-05 ENCOUNTER — Encounter: Payer: Self-pay | Admitting: Family Medicine

## 2024-01-05 VITALS — BP 148/93 | HR 105 | Wt 232.0 lb

## 2024-01-05 DIAGNOSIS — N9089 Other specified noninflammatory disorders of vulva and perineum: Secondary | ICD-10-CM | POA: Diagnosis not present

## 2024-01-05 MED ORDER — VALACYCLOVIR HCL 1 G PO TABS: 1000.0000 mg | ORAL_TABLET | Freq: Every day | ORAL | 5 refills | Status: DC

## 2024-01-05 NOTE — Progress Notes (Signed)
 GYNECOLOGY OFFICE NOTE  History:  43 y.o. G0P0000 here today for labial bump. Reports it comes and goes for about a year, gets up to a marble size, is painful. Occurs maybe every 3 months, has taken doxycycline before with improvement. Has been told it looks like HSV before. Always occurs int he same spot.  Past Medical History:  Diagnosis Date   Abnormal uterine bleeding 04/03/2016   DM (diabetes mellitus), type 2 (HCC)    Eczema    sees dr Terri Piedra   Head ache    High cholesterol    Infection    UTI   Kidney stone 2013   [facility]   Normal pressure hydrocephalus (HCC) 08/2011   [facility]   Obesity, unspecified    Past Surgical History:  Procedure Laterality Date   BREAST REDUCTION SURGERY  07/2010   REDUCTION MAMMAPLASTY Bilateral 2010    Current Outpatient Medications:    atorvastatin (LIPITOR) 80 MG tablet, Take 1 tablet by mouth daily., Disp: , Rfl:    ibuprofen (ADVIL) 600 MG tablet, Take 1 tablet (600 mg total) by mouth every 6 (six) hours as needed., Disp: 30 tablet, Rfl: 0   metFORMIN (GLUCOPHAGE-XR) 500 MG 24 hr tablet, Take 500 mg by mouth 2 (two) times daily with a meal., Disp: , Rfl:    ofloxacin (OCUFLOX) 0.3 % ophthalmic solution, Place 1 drop into the right eye 4 (four) times daily. For 5 days, Disp: 5 mL, Rfl: 0   valACYclovir (VALTREX) 1000 MG tablet, Take 1 tablet (1,000 mg total) by mouth daily. Take for 5 days, Disp: 5 tablet, Rfl: 5   Vitamin D, Ergocalciferol, (DRISDOL) 1.25 MG (50000 UNIT) CAPS capsule, Take 1 capsule by mouth once a week., Disp: , Rfl:    Continuous Glucose Sensor (DEXCOM G7 SENSOR) MISC, 1 each by miscellaneous route every 10 days. (Patient not taking: Reported on 01/05/2024), Disp: , Rfl:    cyclobenzaprine (FLEXERIL) 10 MG tablet, Take 1 tablet (10 mg total) by mouth 2 (two) times daily as needed for muscle spasms. (Patient not taking: Reported on 01/05/2024), Disp: 20 tablet, Rfl: 0   pioglitazone (ACTOS) 15 MG tablet, Take 1  tablet by mouth daily. (Patient not taking: Reported on 01/05/2024), Disp: , Rfl:    triamcinolone cream (KENALOG) 0.1 %, Apply topically. (Patient not taking: Reported on 01/05/2024), Disp: , Rfl:   The following portions of the patient's history were reviewed and updated as appropriate: allergies, current medications, past family history, past medical history, past social history, past surgical history and problem list.   Review of Systems:  Pertinent items noted in HPI and remainder of comprehensive ROS otherwise negative.   Objective:  Physical Exam BP (!) 148/93   Pulse (!) 105   Wt 232 lb (105.2 kg)   LMP 11/30/2023 (Approximate)   BMI 38.61 kg/m  CONSTITUTIONAL: Well-developed, well-nourished female in no acute distress.  HENT:  Normocephalic, atraumatic. External right and left ear normal. Oropharynx is clear and moist EYES: Conjunctivae and EOM are normal. Pupils are equal, round, and reactive to light. No scleral icterus.  NECK: Normal range of motion, supple, no masses SKIN: Skin is warm and dry. No rash noted. Not diaphoretic. No erythema. No pallor. NEUROLOGIC: Alert and oriented to person, place, and time. Normal reflexes, muscle tone coordination. No cranial nerve deficit noted. PSYCHIATRIC: Normal mood and affect. Normal behavior. Normal judgment and thought content. CARDIOVASCULAR: Normal heart rate noted RESPIRATORY: Effort normal, no problems with respiration noted ABDOMEN:  Soft, no distention noted.   PELVIC: Normal appearing external genitalia; 3 mm denuded area on left labia majora, painful to touch, no collection MUSCULOSKELETAL: Normal range of motion. No edema noted.  Exam done with chaperone present.  Labs and Imaging No results found.  Assessment & Plan:  1. 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 Please refer to After Visit Summary for other counseling recommendations.   Return for patient will call for follow up prn.   K. [name], MD, [facility] Attending Center for [facility])

## 2024-01-07 ENCOUNTER — Other Ambulatory Visit: Payer: Self-pay

## 2024-01-07 LAB — HERPES SIMPLEX VIRUS CULTURE

## 2024-01-08 ENCOUNTER — Encounter: Payer: Self-pay | Admitting: Family Medicine

## 2024-01-18 IMAGING — MG MM DIGITAL SCREENING BILAT W/ TOMO AND CAD
6 of 12 series · 6 of 36 positions shown · non-contrast
Comparison: Previous exam(s).

ACR Breast Density Category a: The breast tissue is almost entirely
fatty.

CLINICAL DATA: Screening.

EXAM:
DIGITAL SCREENING BILATERAL MAMMOGRAM WITH TOMOSYNTHESIS AND CAD
TECHNIQUE: Bilateral screening digital craniocaudal and mediolateral oblique
mammograms were obtained. Bilateral screening digital breast
tomosynthesis was performed. The images were evaluated with
computer-aided detection.

[R MLO synth-2D (1 of 2)]
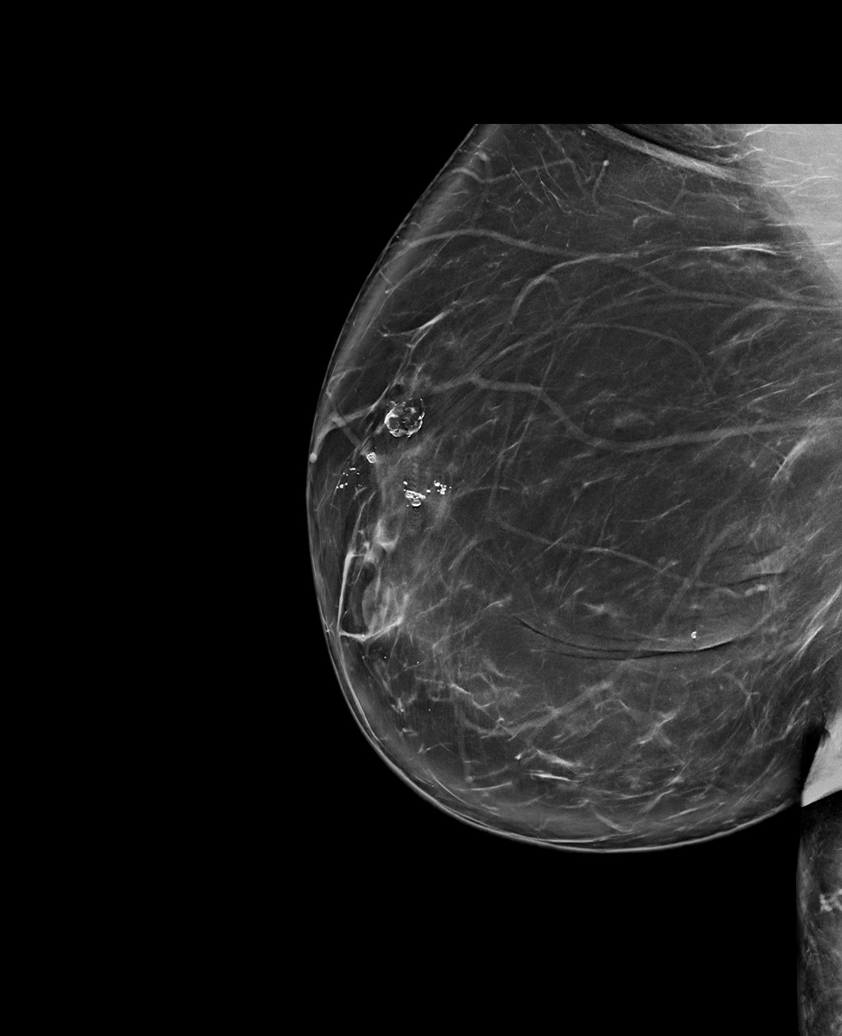

[R CC synth-2D]
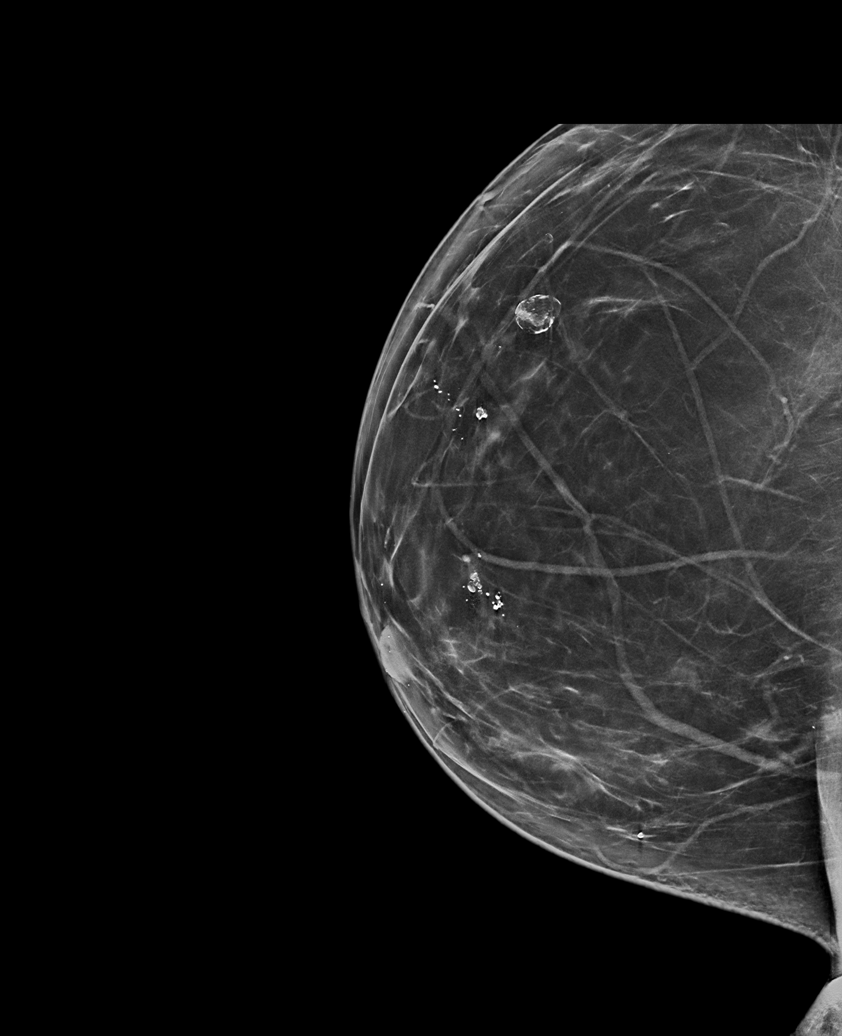

[L CC synth-2D]
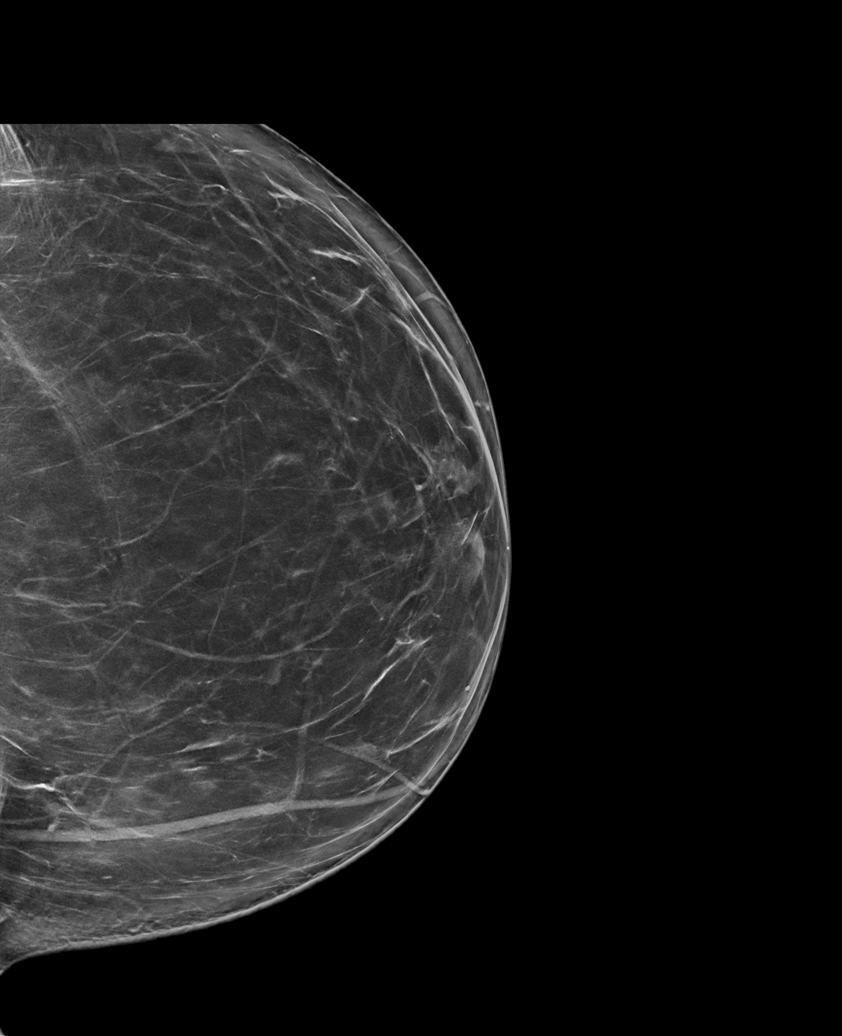

[R MLO synth-2D (2 of 2)]
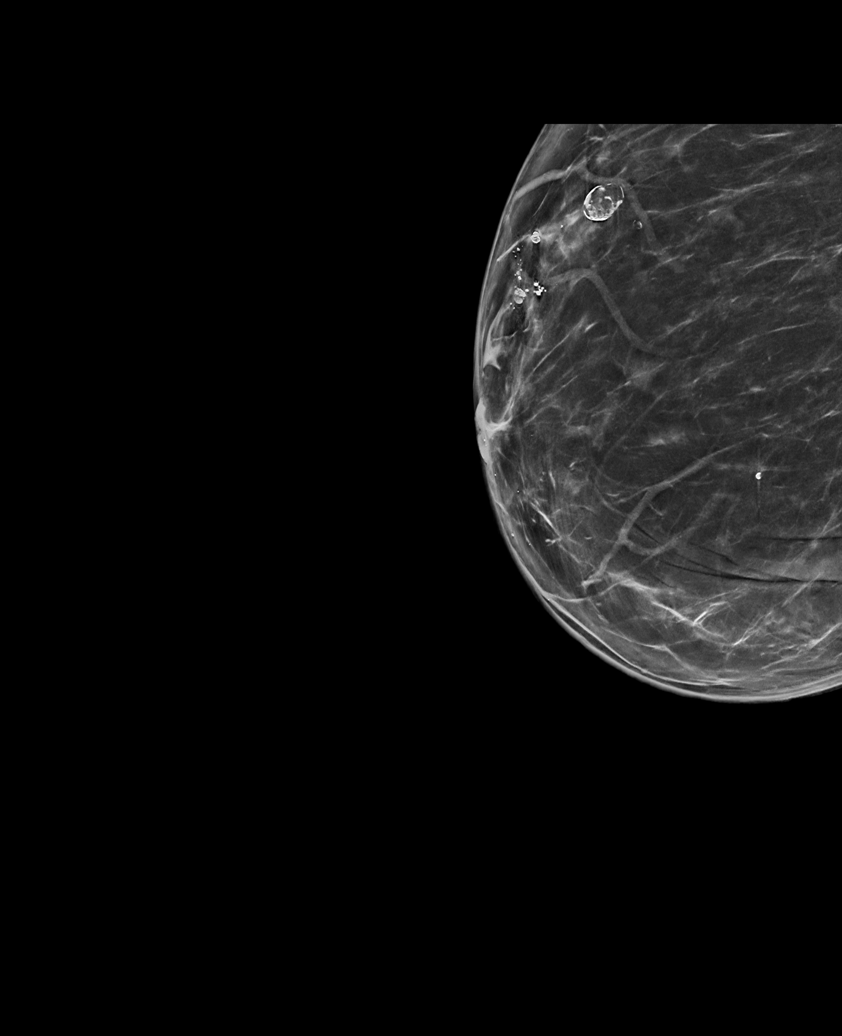

[L MLO synth-2D (1 of 2)]
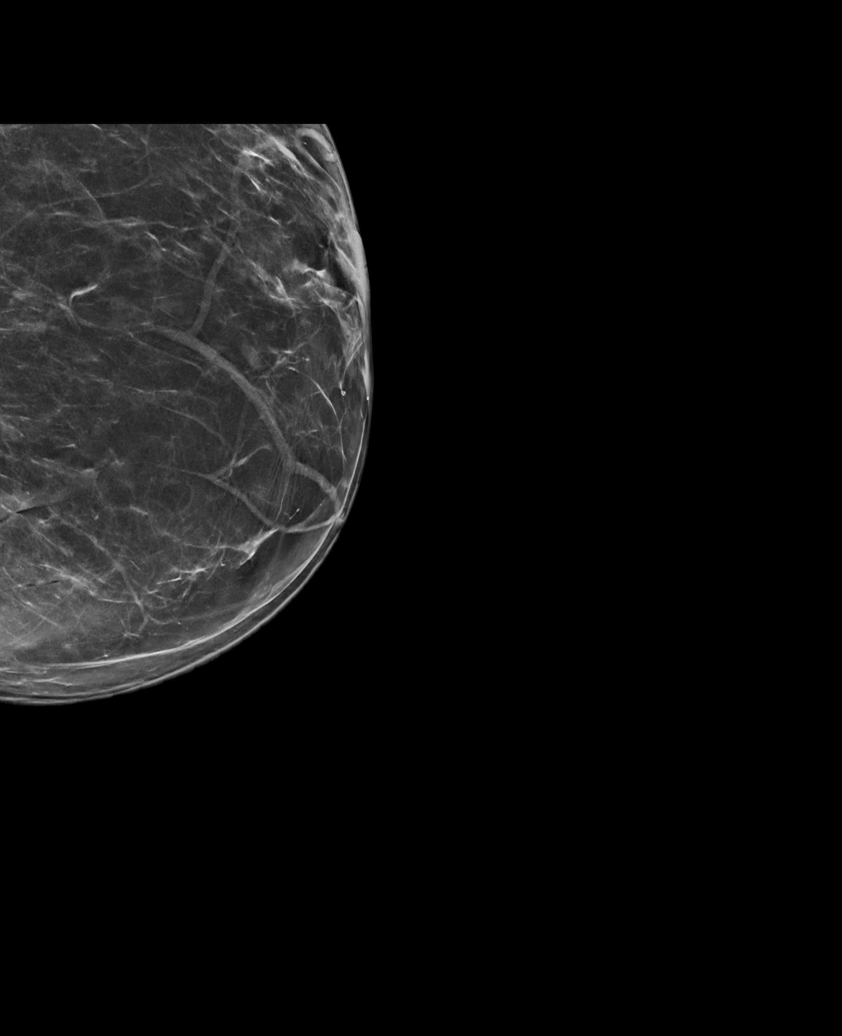

[L MLO synth-2D (2 of 2)]
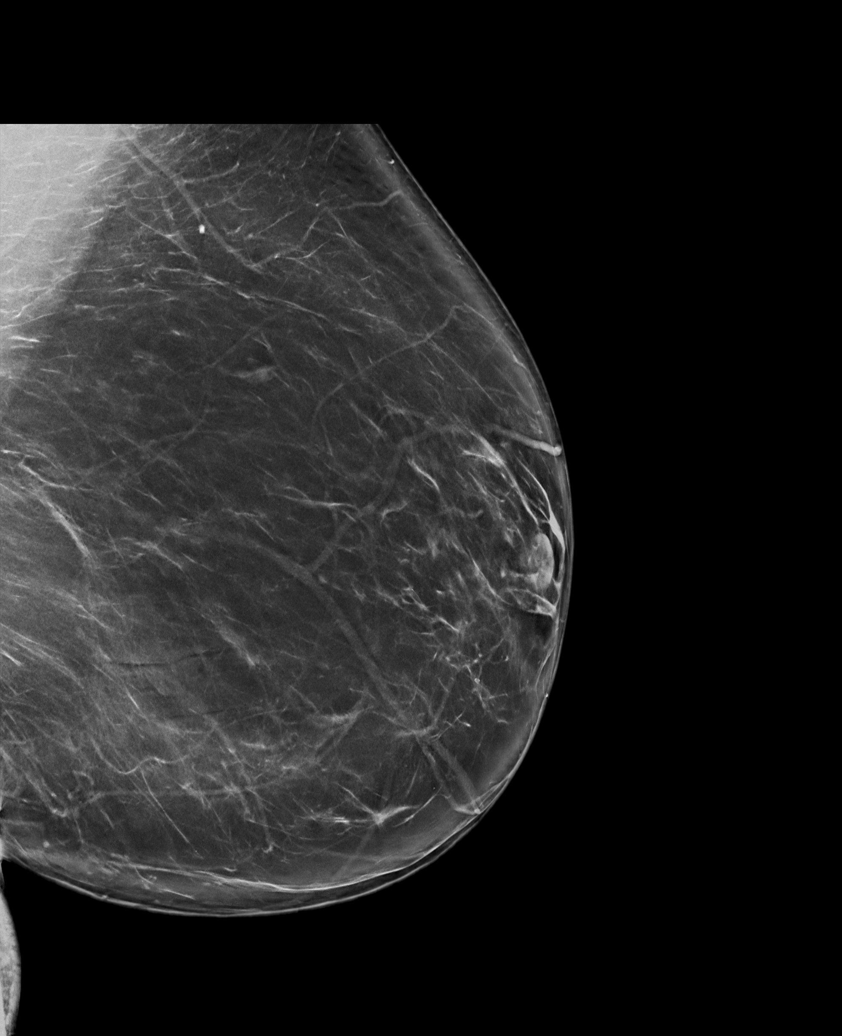

[6 of 36 positions shown; findings below may reference images not displayed]

FINDINGS: In the right breast a possible focal asymmetry, possible asymmetry,
and separate calcifications require further evaluation.

In the left breast a possible focal asymmetry requires further
evaluation.
IMPRESSION: Further evaluation is suggested for possible asymmetries and
separate calcifications in the right breast.

Further evaluation is suggested for possible focal asymmetry in the
left breast.

RECOMMENDATION:
Diagnostic mammogram and possibly ultrasound of both breasts.
(Code:LS-3-225)

The patient will be contacted regarding the findings, and additional
imaging will be scheduled.

BI-RADS CATEGORY  0: Incomplete. Need additional imaging evaluation
and/or prior mammograms for comparison.

## 2024-01-26 ENCOUNTER — Other Ambulatory Visit: Payer: Self-pay

## 2024-01-26 MED ORDER — TRIAMCINOLONE ACETONIDE 0.1 % EX CREA: 1.0000 | TOPICAL_CREAM | Freq: Two times a day (BID) | CUTANEOUS | 1 refills | Status: DC

## 2024-01-26 MED ORDER — GLIPIZIDE ER 10 MG PO TB24: 20.0000 mg | ORAL_TABLET | Freq: Every day | ORAL | 3 refills | Status: DC

## 2024-01-26 MED ORDER — PIOGLITAZONE HCL 15 MG PO TABS: 15.0000 mg | ORAL_TABLET | Freq: Every day | ORAL | 6 refills | Status: DC

## 2024-01-26 MED ORDER — METFORMIN HCL ER 500 MG PO TB24: 500.0000 mg | ORAL_TABLET | Freq: Two times a day (BID) | ORAL | 2 refills | Status: DC

## 2024-01-26 MED ORDER — DEXCOM G7 SENSOR MISC
4 refills | Status: DC
  Filled 2024-01-26: qty 3, 30d supply, fill #0
  Filled 2024-04-04 – 2024-04-12 (×4): qty 9, 90d supply, fill #0

## 2024-01-27 ENCOUNTER — Other Ambulatory Visit: Payer: Self-pay

## 2024-01-28 ENCOUNTER — Other Ambulatory Visit: Payer: Self-pay

## 2024-01-29 ENCOUNTER — Other Ambulatory Visit: Payer: Self-pay

## 2024-02-01 ENCOUNTER — Other Ambulatory Visit: Payer: Self-pay

## 2024-02-02 ENCOUNTER — Other Ambulatory Visit: Payer: Self-pay

## 2024-02-02 DIAGNOSIS — E119 Type 2 diabetes mellitus without complications: Secondary | ICD-10-CM | POA: Diagnosis not present

## 2024-02-05 ENCOUNTER — Other Ambulatory Visit: Payer: Self-pay

## 2024-02-09 ENCOUNTER — Ambulatory Visit: Admitting: Obstetrics and Gynecology

## 2024-02-09 ENCOUNTER — Other Ambulatory Visit: Payer: Self-pay

## 2024-02-11 ENCOUNTER — Other Ambulatory Visit: Payer: Self-pay

## 2024-02-16 ENCOUNTER — Other Ambulatory Visit: Payer: Self-pay

## 2024-02-17 ENCOUNTER — Other Ambulatory Visit: Payer: Self-pay

## 2024-03-07 ENCOUNTER — Other Ambulatory Visit: Payer: Self-pay

## 2024-03-07 DIAGNOSIS — E782 Mixed hyperlipidemia: Secondary | ICD-10-CM | POA: Diagnosis not present

## 2024-03-07 DIAGNOSIS — E1165 Type 2 diabetes mellitus with hyperglycemia: Secondary | ICD-10-CM | POA: Diagnosis not present

## 2024-03-07 MED ORDER — ATORVASTATIN CALCIUM 80 MG PO TABS
80.0000 mg | ORAL_TABLET | Freq: Every day | ORAL | 1 refills | Status: AC
  Filled 2024-03-07: qty 90, 90d supply, fill #0
  Filled 2024-08-02: qty 90, 90d supply, fill #1

## 2024-03-07 MED ORDER — DEXCOM G7 SENSOR MISC
4 refills | Status: DC
  Filled 2024-03-07: qty 3, 30d supply, fill #0

## 2024-03-07 MED ORDER — METFORMIN HCL ER 500 MG PO TB24
500.0000 mg | ORAL_TABLET | Freq: Two times a day (BID) | ORAL | 2 refills | Status: DC
  Filled 2024-03-07: qty 180, 90d supply, fill #0

## 2024-03-07 MED ORDER — PIOGLITAZONE HCL 15 MG PO TABS
15.0000 mg | ORAL_TABLET | Freq: Every day | ORAL | 6 refills | Status: DC
  Filled 2024-03-07: qty 30, 30d supply, fill #0

## 2024-03-07 MED ORDER — GLIPIZIDE ER 5 MG PO TB24
5.0000 mg | ORAL_TABLET | Freq: Every day | ORAL | 3 refills | Status: DC
  Filled 2024-03-07: qty 90, 90d supply, fill #0

## 2024-03-09 ENCOUNTER — Other Ambulatory Visit: Payer: Self-pay

## 2024-03-10 ENCOUNTER — Other Ambulatory Visit: Payer: Self-pay

## 2024-03-15 ENCOUNTER — Other Ambulatory Visit: Payer: Self-pay

## 2024-03-21 ENCOUNTER — Other Ambulatory Visit: Payer: Self-pay

## 2024-03-22 ENCOUNTER — Other Ambulatory Visit: Payer: Self-pay

## 2024-03-24 ENCOUNTER — Other Ambulatory Visit: Payer: Self-pay

## 2024-03-25 ENCOUNTER — Other Ambulatory Visit: Payer: Self-pay

## 2024-03-31 ENCOUNTER — Other Ambulatory Visit: Payer: Self-pay

## 2024-04-04 ENCOUNTER — Other Ambulatory Visit: Payer: Self-pay

## 2024-04-06 ENCOUNTER — Other Ambulatory Visit: Payer: Self-pay

## 2024-04-08 ENCOUNTER — Other Ambulatory Visit: Payer: Self-pay

## 2024-04-10 ENCOUNTER — Encounter: Payer: Self-pay | Admitting: Obstetrics and Gynecology

## 2024-04-11 ENCOUNTER — Telehealth: Payer: Self-pay | Admitting: Family Medicine

## 2024-04-11 ENCOUNTER — Other Ambulatory Visit: Payer: Self-pay

## 2024-04-11 NOTE — Telephone Encounter (Signed)
 Called patent and left upcoming appointment information and call back number.

## 2024-04-12 ENCOUNTER — Other Ambulatory Visit: Payer: Self-pay

## 2024-04-13 ENCOUNTER — Ambulatory Visit
Admission: EM | Admit: 2024-04-13 | Discharge: 2024-04-13 | Disposition: A | Discharge status: Home / Self Care | Attending: Physician Assistant | Admitting: Physician Assistant

## 2024-04-13 ENCOUNTER — Encounter: Payer: Self-pay | Admitting: Emergency Medicine

## 2024-04-13 DIAGNOSIS — J0120 Acute ethmoidal sinusitis, unspecified: Secondary | ICD-10-CM

## 2024-04-13 MED ORDER — VITAMIN D (ERGOCALCIFEROL) 1.25 MG (50000 UNIT) PO CAPS
50000.0000 [IU] | ORAL_CAPSULE | ORAL | 1 refills | Status: DC
Start: 2024-04-13 — End: 2024-09-08

## 2024-04-13 MED ORDER — AMOXICILLIN-POT CLAVULANATE 875-125 MG PO TABS: 1.0000 | ORAL_TABLET | Freq: Two times a day (BID) | ORAL | 0 refills | Status: DC

## 2024-04-13 NOTE — ED Triage Notes (Signed)
 Pt c/o sore throat x's 5 days with cough and congestion  Has taken OTC sinus meds without relief

## 2024-04-13 NOTE — ED Provider Notes (Signed)
 [name]    CSN: [id number] Arrival date & time: 04/13/24  0803      History   Chief Complaint Chief Complaint  Patient presents with   Sore Throat   Cough    HPI Stacey Byrd is a 43 y.o. female.   Patient has a sore throat and sinus congestion.  Patient reports she has had a cough and congestion.  Patient denies any fever or chills.  Patient has a history of sinus infections in the past and this feels the same way.  The history is provided by the patient. No language interpreter was used.  Sore Throat  Cough   Past Medical History:  Diagnosis Date   Abnormal uterine bleeding 04/03/2016   DM (diabetes mellitus), type 2 (HCC)    Eczema    sees dr ivin   Head ache    High cholesterol    Infection    UTI   Kidney stone 2013   [facility]   Normal pressure hydrocephalus (HCC) 08/2011   [facility]   Obesity, unspecified     Patient Active Problem List   Diagnosis Date Noted   Mixed hyperlipidemia 07/08/2019   Type 2 diabetes mellitus (HCC) 07/08/2019   History of endometrial polyp 09/29/2016   Vitamin D  deficiency 02/17/2013   Pure hypercholesterolemia 02/17/2013    Past Surgical History:  Procedure Laterality Date   BREAST REDUCTION SURGERY  07/2010   REDUCTION MAMMAPLASTY Bilateral 2010    OB History     Gravida  0   Para  0   Term  0   Preterm  0   AB  0   Living  0      SAB  0   IAB  0   Ectopic  0   Multiple  0   Live Births               Home Medications    Prior to Admission medications   Medication Sig Start Date End Date Taking? Authorizing Provider  amoxicillin -clavulanate (AUGMENTIN ) 875-125 MG tablet Take 1 tablet by mouth 2 (two) times daily. 04/13/24  Yes Mckaila Duffus K, PA-C  atorvastatin  (LIPITOR) 80 MG tablet Take 1 tablet by mouth daily. 07/12/21   [provider]  atorvastatin  (LIPITOR) 80 MG tablet Take 1 tablet (80 mg total) by mouth daily. 03/07/24     Continuous  Glucose Sensor (DEXCOM G7 SENSOR) MISC 1 each by miscellaneous route every 10 days. Patient not taking: Reported on 01/05/2024 08/05/23   [provider]  Continuous Glucose Sensor (DEXCOM G7 SENSOR) MISC Use one sensor every 10 days 08/05/23     Continuous Glucose Sensor (DEXCOM G7 SENSOR) MISC Change every 10 days. 03/07/24     cyclobenzaprine  (FLEXERIL ) 10 MG tablet Take 1 tablet (10 mg total) by mouth 2 (two) times daily as needed for muscle spasms. Patient not taking: Reported on 01/05/2024 12/28/23   Billy Asberry FALCON, PA-C  glipiZIDE  (GLUCOTROL  XL) 10 MG 24 hr tablet Take 2 tablets (20 mg total) by mouth daily. 08/04/23     glipiZIDE  (GLUCOTROL  XL) 5 MG 24 hr tablet Take 1 tablet (5 mg total) by mouth daily. 03/07/24     ibuprofen  (ADVIL ) 600 MG tablet Take 1 tablet (600 mg total) by mouth every 6 (six) hours as needed. 12/03/20   Mortenson, Ashley, MD  metFORMIN  (GLUCOPHAGE -XR) 500 MG 24 hr tablet Take 1 tablet (500 mg total) by mouth 2 (two) times daily. 08/04/23  metFORMIN  (GLUCOPHAGE -XR) 500 MG 24 hr tablet Take 500 mg by mouth 2 (two) times daily with a meal. 11/07/22   [provider]  metFORMIN  (GLUCOPHAGE -XR) 500 MG 24 hr tablet Take 1 tablet (500 mg total) by mouth 2 (two) times daily. 03/07/24     ofloxacin  (OCUFLOX ) 0.3 % ophthalmic solution Place 1 drop into the right eye 4 (four) times daily. For 5 days 11/27/23   Vivienne Delon HERO, PA-C  pioglitazone  (ACTOS ) 15 MG tablet Take 1 tablet by mouth daily. Patient not taking: Reported on 01/05/2024 08/03/23 08/02/24  [provider]  pioglitazone  (ACTOS ) 15 MG tablet Take 1 tablet (15 mg total) by mouth daily. 08/04/23     pioglitazone  (ACTOS ) 15 MG tablet Take 1 tablet (15 mg total) by mouth daily. 03/07/24     triamcinolone  cream (KENALOG ) 0.1 % Apply topically. Patient not taking: Reported on 01/05/2024 09/04/23   [provider]  triamcinolone  cream (KENALOG ) 0.1 % Apply 1 Application topically 2  (two) times daily. 01/04/24     valACYclovir  (VALTREX ) 1000 MG tablet Take 1 tablet (1,000 mg total) by mouth daily. Take for 5 days 01/05/24   Nicholaus Burnard HERO, MD  Vitamin D , Ergocalciferol , (DRISDOL ) 1.25 MG (50000 UNIT) CAPS capsule Take 1 capsule (50,000 Units total) by mouth once a week. 04/13/24   Flint Sonny POUR, PA-C  cetirizine  (ZYRTEC  ALLERGY) 10 MG tablet Take 1 tablet (10 mg total) by mouth daily. 09/05/20 12/03/20  Hall-Potvin, [place], PA-C    Family History Family History  Problem Relation Age of Onset   Diabetes Mother    Hypertension Mother    Gout Mother    Crohn's disease Mother    Osteoporosis Mother    Kidney disease Mother        mild, related to DM   Hyperlipidemia Mother    Stroke Father 30   Hypertension Father    Seizures Father        related to stroke   Hyperlipidemia Father    Kidney Stones Brother    Hyperlipidemia Maternal Grandmother    Cancer Neg Hx    Heart disease Neg Hx     Social History Social History   Tobacco Use   Smoking status: Never   Smokeless tobacco: Never   Tobacco comments:    quit in early 20's  Vaping Use   Vaping status: Never Used  Substance Use Topics   Alcohol use: Yes    Comment: occasional-monthly   Drug use: No     Allergies   Patient has no known allergies.   Review of Systems Review of Systems  Respiratory:  Positive for cough.   All other systems reviewed and are negative.    Physical Exam Triage Vital Signs ED Triage Vitals  Encounter Vitals Group     BP 04/13/24 0816 132/87     Girls Systolic BP Percentile --      Girls Diastolic BP Percentile --      Boys Systolic BP Percentile --      Boys Diastolic BP Percentile --      Pulse Rate 04/13/24 0816 (!) 112     Resp 04/13/24 0816 18     Temp 04/13/24 0816 98.3 F (36.8 C)     Temp Source 04/13/24 0816 Oral     SpO2 04/13/24 0816 98 %     Weight --      Height --      Head Circumference --  Peak Flow --      Pain Score 04/13/24 0817  8     Pain Loc --      Pain Education --      Exclude from Growth Chart --    No data found.  Updated Vital Signs BP 132/87 (BP Location: Left Arm)   Pulse (!) 112   Temp 98.3 F (36.8 C) (Oral)   Resp 18   LMP 03/30/2024 (Exact Date)   SpO2 98%   Visual Acuity Right Eye Distance:   Left Eye Distance:   Bilateral Distance:    Right Eye Near:   Left Eye Near:    Bilateral Near:     Physical Exam Vitals and nursing note reviewed.  Constitutional:      Appearance: She is well-developed.  HENT:     Head: Normocephalic.     Right Ear: Tympanic membrane normal.     Left Ear: Tympanic membrane normal.   Cardiovascular:     Rate and Rhythm: Normal rate.  Pulmonary:     Effort: Pulmonary effort is normal.  Abdominal:     General: There is no distension.   Musculoskeletal:        General: Normal range of motion.     Cervical back: Normal range of motion.   Skin:    General: Skin is warm.   Neurological:     General: No focal deficit present.     Mental Status: She is alert and oriented to person, place, and time.      UC Treatments / Results  Labs (all labs ordered are listed, but only abnormal results are displayed) Labs Reviewed - No data to display  EKG   Radiology No results found.  Procedures Procedures (including critical care time)  Medications Ordered in UC Medications - No data to display  Initial Impression / Assessment and Plan / UC Course  I have reviewed the triage vital signs and the nursing notes.  Pertinent labs & imaging results that were available during my care of the patient were reviewed by me and considered in my medical decision making (see chart for details).     Patient is tender maxillary and ethmoid sinuses.  Patient is given a prescription for Augmentin  she is advised over-the-counter decongestants. Final Clinical Impressions(s) / UC Diagnoses   Final diagnoses:  Acute ethmoidal sinusitis, recurrence not specified      Discharge Instructions      Return if any problems.    ED Prescriptions     Medication Sig Dispense Auth. Provider   amoxicillin -clavulanate (AUGMENTIN ) 875-125 MG tablet Take 1 tablet by mouth 2 (two) times daily. 20 tablet Yahsir Wickens K, PA-C   Vitamin D , Ergocalciferol , (DRISDOL ) 1.25 MG (50000 UNIT) CAPS capsule Take 1 capsule (50,000 Units total) by mouth once a week. 5 capsule Zyaira Vejar K, PA-C      PDMP not reviewed this encounter. An After Visit Summary was printed and given to the patient.       Flint Sonny POUR, PA-C 04/13/24 [phone]

## 2024-04-13 NOTE — Discharge Instructions (Addendum)
 Return if any problems.

## 2024-04-14 ENCOUNTER — Other Ambulatory Visit: Payer: Self-pay

## 2024-04-14 ENCOUNTER — Encounter: Payer: Self-pay | Admitting: Family Medicine

## 2024-04-14 ENCOUNTER — Ambulatory Visit (INDEPENDENT_AMBULATORY_CARE_PROVIDER_SITE_OTHER): Admitting: Obstetrics & Gynecology

## 2024-04-14 VITALS — BP 144/90 | HR 73 | Wt 231.5 lb

## 2024-04-14 DIAGNOSIS — N9089 Other specified noninflammatory disorders of vulva and perineum: Secondary | ICD-10-CM | POA: Diagnosis not present

## 2024-04-14 MED ORDER — VALACYCLOVIR HCL 1 G PO TABS: 1000.0000 mg | ORAL_TABLET | Freq: Every day | ORAL | 5 refills | Status: AC

## 2024-04-14 MED ORDER — VALACYCLOVIR HCL 500 MG PO TABS: 500.0000 mg | ORAL_TABLET | Freq: Two times a day (BID) | ORAL | 1 refills | Status: DC

## 2024-04-14 NOTE — Progress Notes (Signed)
 Patient ID: Stacey Byrd, female   DOB: July 07, 1981, 43 y.o.   MRN: [id number]  Chief Complaint  Patient presents with   Vulvar Leison    HPI Stacey Byrd is a 43 y.o. female.  G0P0000 Patient's last menstrual period was 03/30/2024 (exact date). She was tested in March for HSV for a vulvar lesion and culture was negative. She had recurrent sx 4 days ago and the sx are improving. First episode was 3 years ago HPI  Past Medical History:  Diagnosis Date   Abnormal uterine bleeding 04/03/2016   DM (diabetes mellitus), type 2 (HCC)    Eczema    sees dr ivin   Head ache    High cholesterol    Infection    UTI   Kidney stone 2013   [facility]   Normal pressure hydrocephalus (HCC) 08/2011   [facility]   Obesity, unspecified     Past Surgical History:  Procedure Laterality Date   BREAST REDUCTION SURGERY  07/2010   REDUCTION MAMMAPLASTY Bilateral 2010    Family History  Problem Relation Age of Onset   Diabetes Mother    Hypertension Mother    Gout Mother    Crohn's disease Mother    Osteoporosis Mother    Kidney disease Mother        mild, related to DM   Hyperlipidemia Mother    Stroke Father 42   Hypertension Father    Seizures Father        related to stroke   Hyperlipidemia Father    Kidney Stones Brother    Hyperlipidemia Maternal Grandmother    Cancer Neg Hx    Heart disease Neg Hx     Social History Social History   Tobacco Use   Smoking status: Never   Smokeless tobacco: Never   Tobacco comments:    quit in early 20's  Vaping Use   Vaping status: Never Used  Substance Use Topics   Alcohol use: Yes    Comment: occasional-monthly   Drug use: No    No Known Allergies  Current Outpatient Medications  Medication Sig Dispense Refill   amoxicillin -clavulanate (AUGMENTIN ) 875-125 MG tablet Take 1 tablet by mouth 2 (two) times daily. 20 tablet 0   atorvastatin  (LIPITOR) 80 MG tablet Take 1 tablet (80 mg total) by mouth daily. 90 tablet 1    metFORMIN  (GLUCOPHAGE -XR) 500 MG 24 hr tablet Take 500 mg by mouth 2 (two) times daily with a meal.     metFORMIN  (GLUCOPHAGE -XR) 500 MG 24 hr tablet Take 1 tablet (500 mg total) by mouth 2 (two) times daily. 180 tablet 2   triamcinolone  cream (KENALOG ) 0.1 % Apply 1 Application topically 2 (two) times daily. 30 g 1   Vitamin D , Ergocalciferol , (DRISDOL ) 1.25 MG (50000 UNIT) CAPS capsule Take 1 capsule (50,000 Units total) by mouth once a week. 5 capsule 1   atorvastatin  (LIPITOR) 80 MG tablet Take 1 tablet by mouth daily.     Continuous Glucose Sensor (DEXCOM G7 SENSOR) MISC 1 each by miscellaneous route every 10 days. (Patient not taking: Reported on 01/05/2024)     Continuous Glucose Sensor (DEXCOM G7 SENSOR) MISC Use one sensor every 10 days 9 each 4   Continuous Glucose Sensor (DEXCOM G7 SENSOR) MISC Change every 10 days. 9 each 4   cyclobenzaprine  (FLEXERIL ) 10 MG tablet Take 1 tablet (10 mg total) by mouth 2 (two) times daily as needed for muscle spasms. (Patient not taking: Reported on 01/05/2024)  20 tablet 0   glipiZIDE  (GLUCOTROL  XL) 10 MG 24 hr tablet Take 2 tablets (20 mg total) by mouth daily. 180 tablet 3   glipiZIDE  (GLUCOTROL  XL) 5 MG 24 hr tablet Take 1 tablet (5 mg total) by mouth daily. 90 tablet 3   ibuprofen  (ADVIL ) 600 MG tablet Take 1 tablet (600 mg total) by mouth every 6 (six) hours as needed. 30 tablet 0   metFORMIN  (GLUCOPHAGE -XR) 500 MG 24 hr tablet Take 1 tablet (500 mg total) by mouth 2 (two) times daily. 180 tablet 2   ofloxacin  (OCUFLOX ) 0.3 % ophthalmic solution Place 1 drop into the right eye 4 (four) times daily. For 5 days 5 mL 0   pioglitazone  (ACTOS ) 15 MG tablet Take 1 tablet by mouth daily. (Patient not taking: Reported on 01/05/2024)     pioglitazone  (ACTOS ) 15 MG tablet Take 1 tablet (15 mg total) by mouth daily. 30 tablet 6   pioglitazone  (ACTOS ) 15 MG tablet Take 1 tablet (15 mg total) by mouth daily. 30 tablet 6   triamcinolone  cream (KENALOG ) 0.1 % Apply  topically. (Patient not taking: Reported on 01/05/2024)     valACYclovir  (VALTREX ) 1000 MG tablet Take 1 tablet (1,000 mg total) by mouth daily. Take for 5 days 5 tablet 5   No current facility-administered medications for this visit.    Review of Systems Review of Systems  Constitutional: Negative.   Genitourinary:  Positive for vaginal pain (vulvar lesion).    Blood pressure (!) 144/90, pulse 73, weight 231 lb 8 oz (105 kg), last menstrual period 03/30/2024.  Physical Exam Physical Exam Vitals and nursing note reviewed. Exam conducted with a chaperone present.  Constitutional:      Appearance: She is obese.  Genitourinary:     Comments: Area with 3 lesions herpetic appearance and mild tenderness, swab obtained  Neurological:     Mental Status: She is alert.     Data Reviewed Previous herpes testing negative  Assessment Suspect herpes outbreak   Plan Orders Placed This Encounter  Procedures   HSV 2 antibody, IgG   Herpes simplex virus(hsv) dna by pcr   HSV-2 Ab, IgG   Meds ordered this encounter  Medications   DISCONTD: valACYclovir  (VALTREX ) 500 MG tablet    Sig: Take 1 tablet (500 mg total) by mouth 2 (two) times daily.    Dispense:  10 tablet    Refill:  1   valACYclovir  (VALTREX ) 1000 MG tablet    Sig: Take 1 tablet (1,000 mg total) by mouth daily. Take for 5 days    Dispense:  5 tablet    Refill:  5       Lynwood Solomons 04/14/2024, 5:08 PM

## 2024-04-15 LAB — HSV-2 AB, IGG: HSV 2 IgG, Type Spec: REACTIVE — AB

## 2024-04-18 LAB — HSV DNA BY PCR (REFERENCE LAB)
HSV 2 DNA: NEGATIVE
HSV-1 DNA: NEGATIVE

## 2024-04-19 ENCOUNTER — Telehealth: Admitting: Physician Assistant

## 2024-04-19 DIAGNOSIS — T3695XA Adverse effect of unspecified systemic antibiotic, initial encounter: Secondary | ICD-10-CM

## 2024-04-19 DIAGNOSIS — B379 Candidiasis, unspecified: Secondary | ICD-10-CM | POA: Diagnosis not present

## 2024-04-19 MED ORDER — FLUCONAZOLE 150 MG PO TABS: ORAL_TABLET | ORAL | 0 refills | Status: DC

## 2024-04-19 NOTE — Progress Notes (Signed)

## 2024-04-19 NOTE — Progress Notes (Signed)
 I have spent 5 minutes in review of e-visit questionnaire, review and updating patient chart, medical decision making and response to patient.   Piedad Climes, PA-C

## 2024-04-24 ENCOUNTER — Ambulatory Visit: Payer: Self-pay | Admitting: Obstetrics & Gynecology

## 2024-04-26 ENCOUNTER — Other Ambulatory Visit: Payer: Self-pay

## 2024-04-26 ENCOUNTER — Telehealth: Admitting: Physician Assistant

## 2024-04-26 DIAGNOSIS — L259 Unspecified contact dermatitis, unspecified cause: Secondary | ICD-10-CM | POA: Diagnosis not present

## 2024-04-26 MED ORDER — HYDROXYZINE PAMOATE 25 MG PO CAPS: 25.0000 mg | ORAL_CAPSULE | Freq: Three times a day (TID) | ORAL | 0 refills | Status: DC | PRN

## 2024-04-26 MED ORDER — TRIAMCINOLONE ACETONIDE 0.1 % EX CREA: 1.0000 | TOPICAL_CREAM | Freq: Two times a day (BID) | CUTANEOUS | 0 refills | Status: AC

## 2024-04-26 NOTE — Progress Notes (Signed)
 Virtual Visit Consent   Stacey Byrd, you are scheduled for a virtual visit with a [facility] provider today. Just as with appointments in the office, your consent must be obtained to participate. Your consent will be active for this visit and any virtual visit you may have with one of our providers in the next 365 days. If you have a MyChart account, a copy of this consent can be sent to you electronically.  As this is a virtual visit, video technology does not allow for your provider to perform a traditional examination. This may limit your provider's ability to fully assess your condition. If your provider identifies any concerns that need to be evaluated in person or the need to arrange testing (such as labs, EKG, etc.), we will make arrangements to do so. Although advances in technology are sophisticated, we cannot ensure that it will always work on either your end or our end. If the connection with a video visit is poor, the visit may have to be switched to a telephone visit. With either a video or telephone visit, we are not always able to ensure that we have a secure connection.  By engaging in this virtual visit, you consent to the provision of healthcare and authorize for your insurance to be billed (if applicable) for the services provided during this visit. Depending on your insurance coverage, you may receive a charge related to this service.  I need to obtain your verbal consent now. Are you willing to proceed with your visit today? Stacey Byrd has provided verbal consent on 04/26/2024 for a virtual visit (video or telephone). Stacey Byrd, [place]  Date: 04/26/2024 8:35 AM   Virtual Visit via Video Note   I, Stacey Byrd, connected with  Stacey Byrd  ([id number], 1981-10-03) on 04/26/24 at  8:30 AM EDT by a video-enabled telemedicine application and verified that I am speaking with the correct person using two identifiers.  Location: Patient: Virtual Visit  Location Patient: Home Provider: Virtual Visit Location Provider: Home Office   I discussed the limitations of evaluation and management by telemedicine and the availability of in person appointments. The patient expressed understanding and agreed to proceed.    History of Present Illness: Stacey Byrd is a 43 y.o. who identifies as a female who was assigned female at birth, and is being seen today for red, bumpy rash of chest first noted Sunday (2 days ago). Endorses first noted a few, small bumps on her upper chest. Notes pruritic and uncomfortable. Denies fever, chills. Is now spreading up to her neck and a few on her breasts.  Denies sick contact. Denies recent travel. Denies change to soaps, lotions or detergents that she is aware of. Some increased sun exposure but nothing substantial.   OTC -- Anti-itch creams OTC slightly helpful.  HPI: HPI  Problems:  Patient Active Problem List   Diagnosis Date Noted   Mixed hyperlipidemia 07/08/2019   Type 2 diabetes mellitus (HCC) 07/08/2019   History of endometrial polyp 09/29/2016   Vitamin D  deficiency 02/17/2013   Pure hypercholesterolemia 02/17/2013    Allergies: No Known Allergies Medications:  Current Outpatient Medications:    hydrOXYzine  (VISTARIL ) 25 MG capsule, Take 1 capsule (25 mg total) by mouth every 8 (eight) hours as needed., Disp: 30 capsule, Rfl: 0   triamcinolone  cream (KENALOG ) 0.1 %, Apply 1 Application topically 2 (two) times daily., Disp: 30 g, Rfl: 0   atorvastatin  (LIPITOR) 80 MG tablet, Take 1  tablet (80 mg total) by mouth daily., Disp: 90 tablet, Rfl: 1   fluconazole  (DIFLUCAN ) 150 MG tablet, Take 1 tablet PO once. Repeat in 3 days if needed., Disp: 2 tablet, Rfl: 0   metFORMIN  (GLUCOPHAGE -XR) 500 MG 24 hr tablet, Take 1 tablet (500 mg total) by mouth 2 (two) times daily., Disp: 180 tablet, Rfl: 2   pioglitazone  (ACTOS ) 15 MG tablet, Take 1 tablet (15 mg total) by mouth daily., Disp: 30 tablet, Rfl: 6    valACYclovir  (VALTREX ) 1000 MG tablet, Take 1 tablet (1,000 mg total) by mouth daily. Take for 5 days, Disp: 5 tablet, Rfl: 5   Vitamin D , Ergocalciferol , (DRISDOL ) 1.25 MG (50000 UNIT) CAPS capsule, Take 1 capsule (50,000 Units total) by mouth once a week., Disp: 5 capsule, Rfl: 1  Observations/Objective: Patient is well-developed, well-nourished in no acute distress.  Resting comfortably  at home.  Head is normocephalic, atraumatic.  No labored breathing.  Speech is clear and coherent with logical content.  Patient is alert and oriented at baseline.  Erythematous papular rash of central chest, extending up to the neck. Rash is bilateral. No vesicular lesions noted.  Assessment and Plan: 1. Contact dermatitis, unspecified contact dermatitis type, unspecified trigger (Primary) - triamcinolone  cream (KENALOG ) 0.1 %; Apply 1 Application topically 2 (two) times daily.  Dispense: 30 g; Refill: 0 - hydrOXYzine  (VISTARIL ) 25 MG capsule; Take 1 capsule (25 mg total) by mouth every 8 (eight) hours as needed.  Dispense: 30 capsule; Refill: 0  Keep skin clean and dry. Will Rx topical Triamcinolone  to apply BID. Hydroxyzine  per orders. Avoiding oral steroids due to DM. In-person follow-up precautions reviewed with patient.   Follow Up Instructions: I discussed the assessment and treatment plan with the patient. The patient was provided an opportunity to ask questions and all were answered. The patient agreed with the plan and demonstrated an understanding of the instructions.  A copy of instructions were sent to the patient via MyChart unless otherwise noted below.   The patient was advised to call back or seek an in-person evaluation if the symptoms worsen or if the condition fails to improve as anticipated.    Stacey Velma Lunger, PA-C

## 2024-04-26 NOTE — Patient Instructions (Signed)
 Stacey Byrd, thank you for joining Stacey Velma Lunger, Stacey Byrd for today's virtual visit.  While this provider is not your primary care provider (PCP), if your PCP is located in our provider database this encounter information will be shared with them immediately following your visit.   A [facility] MyChart account gives you access to today's visit and all your visits, tests, and labs performed at [facility]  click here if you don't have a [facility] MyChart account or go to [email or web address]  Consent: (Patient) Stacey Byrd provided verbal consent for this virtual visit at the beginning of the encounter.  Current Medications:  Current Outpatient Medications:    amoxicillin -clavulanate (AUGMENTIN ) 875-125 MG tablet, Take 1 tablet by mouth 2 (two) times daily., Disp: 20 tablet, Rfl: 0   atorvastatin  (LIPITOR) 80 MG tablet, Take 1 tablet by mouth daily., Disp: , Rfl:    atorvastatin  (LIPITOR) 80 MG tablet, Take 1 tablet (80 mg total) by mouth daily., Disp: 90 tablet, Rfl: 1   Continuous Glucose Sensor (DEXCOM G7 SENSOR) MISC, Change every 10 days., Disp: 9 each, Rfl: 4   fluconazole  (DIFLUCAN ) 150 MG tablet, Take 1 tablet PO once. Repeat in 3 days if needed., Disp: 2 tablet, Rfl: 0   glipiZIDE  (GLUCOTROL  XL) 10 MG 24 hr tablet, Take 2 tablets (20 mg total) by mouth daily., Disp: 180 tablet, Rfl: 3   metFORMIN  (GLUCOPHAGE -XR) 500 MG 24 hr tablet, Take 1 tablet (500 mg total) by mouth 2 (two) times daily., Disp: 180 tablet, Rfl: 2   pioglitazone  (ACTOS ) 15 MG tablet, Take 1 tablet (15 mg total) by mouth daily., Disp: 30 tablet, Rfl: 6   triamcinolone  cream (KENALOG ) 0.1 %, Apply topically. (Patient not taking: Reported on 01/05/2024), Disp: , Rfl:    triamcinolone  cream (KENALOG ) 0.1 %, Apply 1 Application topically 2 (two) times daily., Disp: 30 g, Rfl: 1   valACYclovir  (VALTREX ) 1000 MG tablet, Take 1 tablet (1,000 mg total) by mouth daily. Take for 5 days, Disp: 5  tablet, Rfl: 5   Vitamin D , Ergocalciferol , (DRISDOL ) 1.25 MG (50000 UNIT) CAPS capsule, Take 1 capsule (50,000 Units total) by mouth once a week., Disp: 5 capsule, Rfl: 1   Medications ordered in this encounter:  No orders of the defined types were placed in this encounter.    *If you need refills on other medications prior to your next appointment, please contact your pharmacy*  Follow-Up: Call back or seek an in-person evaluation if the symptoms worsen or if the condition fails to improve as anticipated.  [facility] [phone]  Other Instructions Please keep the skin clean and dry. Apply topical Triamcinolone  twice daily as directed. The hydroxyzine  can be taken up to every 8 hours as needed for itch and inflammation. If you note any non-resolving, new, or worsening symptoms despite treatment, please seek an in-person evaluation ASAP.    If you have been instructed to have an in-person evaluation today at a local Urgent Care facility, please use the link below. It will take you to a list of all of our available [facility], including address, phone number and hours of operation. Please do not delay care.  [facility]  If you or a family member do not have a primary care provider, use the link below to schedule a visit and establish care. When you choose a [facility] primary care physician or advanced practice provider, you gain a long-term partner in health. Find  a Primary Care Provider  Learn more about [facility]'s in-office and virtual care options: [facility] - Get Care Now

## 2024-05-03 ENCOUNTER — Encounter: Payer: Self-pay | Admitting: Lactation Services

## 2024-05-03 DIAGNOSIS — B009 Herpesviral infection, unspecified: Secondary | ICD-10-CM | POA: Insufficient documentation

## 2024-05-12 ENCOUNTER — Telehealth: Admitting: Physician Assistant

## 2024-05-12 DIAGNOSIS — H1032 Unspecified acute conjunctivitis, left eye: Secondary | ICD-10-CM | POA: Diagnosis not present

## 2024-05-12 MED ORDER — POLYMYXIN B-TRIMETHOPRIM 10000-0.1 UNIT/ML-% OP SOLN: 1.0000 [drp] | Freq: Four times a day (QID) | OPHTHALMIC | 0 refills | Status: AC

## 2024-05-12 NOTE — Progress Notes (Signed)
 Message sent to patient requesting further input regarding current symptoms. Awaiting patient response.

## 2024-05-12 NOTE — Progress Notes (Signed)
 I have spent 5 minutes in review of e-visit questionnaire, review and updating patient chart, medical decision making and response to patient.   Laure Kidney, PA-C

## 2024-05-12 NOTE — Progress Notes (Signed)

## 2024-05-16 DIAGNOSIS — H16223 Keratoconjunctivitis sicca, not specified as Sjogren's, bilateral: Secondary | ICD-10-CM | POA: Diagnosis not present

## 2024-05-16 DIAGNOSIS — B308 Other viral conjunctivitis: Secondary | ICD-10-CM | POA: Diagnosis not present

## 2024-05-23 DIAGNOSIS — H16223 Keratoconjunctivitis sicca, not specified as Sjogren's, bilateral: Secondary | ICD-10-CM | POA: Diagnosis not present

## 2024-05-23 DIAGNOSIS — B308 Other viral conjunctivitis: Secondary | ICD-10-CM | POA: Diagnosis not present

## 2024-06-02 ENCOUNTER — Encounter: Payer: Self-pay | Admitting: Clinical

## 2024-06-02 ENCOUNTER — Ambulatory Visit: Admitting: Clinical

## 2024-06-02 DIAGNOSIS — F4321 Adjustment disorder with depressed mood: Secondary | ICD-10-CM

## 2024-06-02 DIAGNOSIS — H1045 Other chronic allergic conjunctivitis: Secondary | ICD-10-CM | POA: Diagnosis not present

## 2024-06-02 DIAGNOSIS — H01115 Allergic dermatitis of left lower eyelid: Secondary | ICD-10-CM | POA: Diagnosis not present

## 2024-06-02 DIAGNOSIS — H16143 Punctate keratitis, bilateral: Secondary | ICD-10-CM | POA: Diagnosis not present

## 2024-06-02 DIAGNOSIS — H01112 Allergic dermatitis of right lower eyelid: Secondary | ICD-10-CM | POA: Diagnosis not present

## 2024-06-02 NOTE — Progress Notes (Signed)
 [facility] Counselor Initial Adult Exam  Name: Stacey Byrd Date: 06/02/2024 MRN: [id number] DOB: 11-13-1980 PCP: Younger, Curtistine DASEN, NP  Patient location: Parked car in [place] Provider location: [facility] All persons participating in visit: Patient & this Clinician  I connected with patient and/or family via Engineer, civil (consulting)  (Video is Caregility application) and verified that I am speaking with the correct person using two identifiers. Discussed confidentiality: Yes   I discussed the limitations of telemedicine and the availability of in person appointments.  Discussed there is a possibility of technology failure and discussed alternative modes of communication if that failure occurs.  I discussed that engaging in this telemedicine visit, they consent to the provision of behavioral healthcare and the services will be billed under their insurance.  Patient and/or legal guardian expressed understanding and consented to Telemedicine visit: Yes - Video Visit  Time spent: 35  Guardian/Payee:  Self    Paperwork requested: No   Reason for Visit /Presenting Problem: Establishing a therapist, wants strategies to help her make progress in life  Reported Symptoms:  Feeling depressed about the lack of progress and commitments in her life  Risk Assessment: Danger to Self:  No Self-injurious Behavior: No Danger to Others: No Duty to Warn:no Patient / guardian was educated about steps to take if suicide or homicide risk level increases between visits: no While future psychiatric events cannot be accurately predicted, the patient does not currently require acute inpatient psychiatric care and does not currently meet [place]  involuntary commitment criteria.  Substance Abuse History: Current substance abuse: None reported    Past Psychiatric History:   Outpatient Providers:1 session one time History of Psych Hospitalization: No   Psychological Testing: None reported   Abuse History:  None reported  Family History:  Family History  Problem Relation Age of Onset   Diabetes Mother    Hypertension Mother    Gout Mother    Crohn's disease Mother    Osteoporosis Mother    Kidney disease Mother        mild, related to DM   Hyperlipidemia Mother    Stroke Father 55   Hypertension Father    Seizures Father        related to stroke   Hyperlipidemia Father    Kidney Stones Brother    Hyperlipidemia Maternal Grandmother    Cancer Neg Hx    Heart disease Neg Hx     Living situation: the patient lives by herself  Sexual Orientation: Not reported  Relationship Status: single   Support Systems: Best friend, mother, prayer  Financial Stress:  Yes   Income/Employment/Disability: Employment - Environmental health practitioner. Was not able to work last year so had to live with mother.  She was able to get a job this year and one of her goals was to return to [employer] for employment, which happened this year.  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, research (physical sciences) in [employer], 1st one in Medical Administration Goal for career- wants to be in management  Religion/Spirituality/World View: Raised Jehovah's Witness - not baptized  Any cultural differences that may affect / interfere with treatment:  will need additional information  Recreation/Hobbies: Shopping- clothes, make up, jewelry Cooking Movies -  Black & White movies  Stressors:  Feeling stuck Commitment issues  Strengths: Supportive Relationships, Friends, and Able to Social worker, Being helpful  Barriers:  Low  self-esteem, minimal self-confidence   Legal History: None reported  Medical History/Surgical History: not reviewed Past Medical History:  Diagnosis Date   Abnormal uterine bleeding 04/03/2016   DM (diabetes mellitus), type 2 (HCC)     Eczema    sees dr ivin   Head ache    High cholesterol    Infection    UTI   Kidney stone 2013   [facility]   Normal pressure hydrocephalus (HCC) 08/2011   [facility]   Obesity, unspecified     Past Surgical History:  Procedure Laterality Date   BREAST REDUCTION SURGERY  07/2010   REDUCTION MAMMAPLASTY Bilateral 2010    Medications: Current Outpatient Medications  Medication Sig Dispense Refill   atorvastatin (LIPITOR) 80 MG tablet Take 1 tablet (80 mg total) by mouth daily. 90 tablet 1   fluconazole (DIFLUCAN) 150 MG tablet Take 1 tablet PO once. Repeat in 3 days if needed. 2 tablet 0   hydrOXYzine (VISTARIL) 25 MG capsule Take 1 capsule (25 mg total) by mouth every 8 (eight) hours as needed. 30 capsule 0   metFORMIN (GLUCOPHAGE-XR) 500 MG 24 hr tablet Take 1 tablet (500 mg total) by mouth 2 (two) times daily. 180 tablet 2   pioglitazone (ACTOS) 15 MG tablet Take 1 tablet (15 mg total) by mouth daily. 30 tablet 6   triamcinolone cream (KENALOG) 0.1 % Apply 1 Application topically 2 (two) times daily. 30 g 0   valACYclovir (VALTREX) 1000 MG tablet Take 1 tablet (1,000 mg total) by mouth daily. Take for 5 days 5 tablet 5   Vitamin D, Ergocalciferol, (DRISDOL) 1.25 MG (50000 UNIT) CAPS capsule Take 1 capsule (50,000 Units total) by mouth once a week. 5 capsule 1   No current facility-administered medications for this visit.    No Known Allergies  Diagnoses:  Adjustment disorder with depressed mood  Plan of Care:  Individual Psycho therapy - Scheduled follow up Virtual 12pm-1pm 06/16/2024 Increased knowledge and implementing of coping skills. Explore values that are important to her.  Rolin SHAUNNA Pouch, LCSW   Individualized Treatment Plan  Strengths: People Public librarian, Being helpful  Supports: Best Friend & Mother   Goal/Needs for Treatment:  In order of importance to patient 1) Identify values that are important to her 2) Increase knowledge of  coping skills   Client Statement of Needs: Learning strategies to help her with her commitment issues and feeling stuck   Treatment Level:Outpatient therapy  Symptoms:Low self esteem, low self-confidence  Client Treatment Preferences:Video Visits   Healthcare consumer's goal for treatment: Increased knowledge and implementing of coping skills. Explore values that are important to her.  Healthcare consumer will: Actively participate in therapy, working towards healthy functioning.   This Clinician will provide psycho education and strategies that she can implement to cope with various adjustments in her life in order to build up her tool box of strategies & skills.    *Justification for Continuation/Discontinuation of Goal: R=Revised, O=Ongoing, A=Achieved, D=Discontinued  Goal 1)  Increased knowledge and implementing of coping skills. Target Date Goal Was reviewed Status Code Progress towards goal/Likert rating  07/14/2024                Goal 2)  Explore values that are important to her. Target Date Goal Was reviewed Status Code Progress towards goal/Likert rating  07/14/24                 Electronic signature sign off request for treatment plan sent via  MyChart on 06/02/24  This plan has been reviewed and created by the following participants:Odeth & this Clinician   This plan will be reviewed at least every 12 months. Date Behavioral Health Clinician Date Guardian/Patient   06/02/2024  Alyxander Kollmann P. Trudy 06/02/2024 [name] Smither

## 2024-06-14 DIAGNOSIS — H01115 Allergic dermatitis of left lower eyelid: Secondary | ICD-10-CM | POA: Diagnosis not present

## 2024-06-14 DIAGNOSIS — H01112 Allergic dermatitis of right lower eyelid: Secondary | ICD-10-CM | POA: Diagnosis not present

## 2024-06-16 ENCOUNTER — Encounter: Payer: Self-pay | Admitting: Clinical

## 2024-06-16 ENCOUNTER — Ambulatory Visit (INDEPENDENT_AMBULATORY_CARE_PROVIDER_SITE_OTHER): Admitting: Clinical

## 2024-06-16 DIAGNOSIS — F4321 Adjustment disorder with depressed mood: Secondary | ICD-10-CM

## 2024-06-16 NOTE — Progress Notes (Signed)
 [facility] Counselor/Therapist Progress Note  Patient ID: Stacey Byrd, MRN: [id number]    Date: 06/16/24  Time Spent: 1200pm   - 1245  : 45 Minutes  Types of Service: Individual psychotherapy and Video visit  Patient/Family location: [place], [place] Therapist location: [facility]  All persons participating in visit: Client & Therapist  I connected with client and/or family via Video Enabled Telemedicine Application  (Video is Caregility application) and verified that I am speaking with the correct person using two identifiers. Discussed confidentiality: Yes   I discussed the limitations of telemedicine and the availability of in person appointments.  Discussed there is a possibility of technology failure and discussed alternative modes of communication if that failure occurs.  I discussed that engaging in this telemedicine visit, they consent to the provision of behavioral healthcare and the services will be billed under their insurance.  Patient and/or legal guardian expressed understanding and consented to Telemedicine visit: Yes    Reported Symptoms: Increased stress due to start of school, tense, anxious  Mental Status Exam: Appearance:  Neat     Behavior: Appropriate  Motor: Normal  Speech/Language:  Normal Rate  Affect: Appropriate  Mood: irritable  Thought process: normal  Thought content:   WNL  Sensory/Perceptual disturbances:   WNL  Orientation: oriented to person, place, time/date, situation, and day of week  Attention: Good  Concentration: Fair  Memory: WNL  Fund of knowledge:  Good  Insight:   Good  Judgment:  Good  Impulse Control: Good   Risk Assessment: Danger to Self:  No Self-injurious Behavior: No Danger to Others: No Duty to Warn:no   Subjective:   Stacey Byrd presented to alert and reported feeling irritable & angry.  She reported decreased sleep due to homework.  She is taking 2 classes and working full time.   She also reported feeling  increased stress with supporting a friend that is going through a difficult situation.   Interventions: Mindfulness Meditation and Solution-Oriented/Positive Psychology  Client Response: Stacey Byrd was able to identify her goal & what's important to her. Goal is to transfer to [employer] next year since she will be able to do that after she completes these classes. - Connection to people is important to her and she learns better with a live class but neither classes are in-person.  Fletcher actively participated with identifying solutions to decrease her overall stress with school, work & friends. She will practice mindfulness activities. She was able to reframe her thoughts about taking on other people's stressors and open to ideas on how to put it into practice.  Diagnosis:  Adjustment disorder with depressed mood   Goals, Assessment & Plan:   Continue with individual psycho therapy.  Frequency: bi-weekly  Modality: individual    Short-term goal:  Identify values that are important to her - She was able to identify connection with people is important.  Target Date: 07/14/24  Progress: Ongoing     Short-term goal: Increase knowledge of coping skills - Increased knowledge on mindfulness  Target Date: 07/14/24  Progress: Ongoing    Dominik Lauricella P. Trudy, MSW, LCSW [employer] Therapist Main Office: [phone]                Rolin SHAUNNA Trudy, [place]

## 2024-06-27 ENCOUNTER — Ambulatory Visit: Admitting: Clinical

## 2024-06-27 DIAGNOSIS — F4321 Adjustment disorder with depressed mood: Secondary | ICD-10-CM

## 2024-06-27 NOTE — Progress Notes (Unsigned)
 Stacey Byrd  Patient ID: Stacey Byrd, MRN: [id number]    Date: 06/27/2024  Time Spent: 1156   - 1250  : 54 Minutes  Types of Service: Individual psychotherapy and Video visit  Patient/Family location: [place], [place] Therapist location: [facility] [place] All persons participating in visit: Client & this Clinician  I connected with client and/or family via Video Enabled Telemedicine Application  (Video is Caregility application) and verified that I am speaking with the correct person using two identifiers. Discussed confidentiality: Yes   I discussed the limitations of telemedicine and the availability of in person appointments.  Discussed there is a possibility of technology failure and discussed alternative modes of communication if that failure occurs.  I discussed that engaging in this telemedicine visit, they consent to the provision of behavioral healthcare and the services will be billed under their insurance.  Patient and/or legal guardian expressed understanding and consented to Telemedicine visit: Yes    Reported Symptoms: Self-doubt  Mental Status Exam: Appearance:  Neat     Behavior: Appropriate  Motor: Normal  Speech/Language:  Normal Rate  Affect: Appropriate  Mood: normal  Thought process: normal  Thought content:   WNL  Sensory/Perceptual disturbances:   WNL  Orientation: oriented to person, place, time/date, and situation  Attention: Good  Concentration: Good  Memory: WNL  Fund of knowledge:  Good  Insight:   Good  Judgment:  Good  Impulse Control: Good   Risk Assessment: Danger to Self:  No Self-injurious Behavior: No Danger to Others: No Duty to Warn:no   Subjective:   Stacey Byrd presented to be alert and open to sharing her thoughts & feelings today.  Interventions: Cognitive Behavioral Therapy - Identified thoughts, feelings & actions. Explored core beliefs about herself.  Client Response: Stacey Byrd  reported the following accomplishment: Assignments completed for school by Wednesday Was able to prioritize activities this past weekend in order to complete assignments  Do one thing she enjoyed (watch shows)  She identified one thing she wants to start doing- wants to start off the day with reading the bible or exercise. She decide she will try to listen to bible verses or inspirational things she can listen to as she does her morning routine.  Identified a quote her mom has shared with her that is meaningful to her.As long as there's life, there's hope  Stacey Byrd was open to information on unhealthy thinking habits or thought traps that can lead to negative self talk.   She was open to challenging those unhelpful thoughts.  Start with replacing those thoughts with telling herself to give yourself some grace when she doesn't meet her own expectations   Diagnosis:  Adjustment disorder with depressed mood   Goals, Assessment & Plan:    Continue with individual psycho therapy. Prefers Thursdays around lunch time - Virtual visits - will schedule in October   Frequency: bi-weekly  Modality: individual - Virtual      Goal: Identify values that are important to her as evidenced by self-report. Stacey Byrd identified that starting her day with reading the bible is important to her.   Target Date: 07/14/24  Progress: Ongoing      Goal: Increase knowledge of coping skills - Increase knowledge on unhealthy thinking habits (CBT)  This Clinician will (Send info via My Chart) about thinking habits & core beliefs for her to review.  Stacey Byrd will practice replacing unhelpful thoughts with more helpful ones.  Target Date: 07/14/24  Progress: Ongoing  Stacey Byrd P. Trudy, MSW, LCSW [employer] Therapist Main Office: [phone]

## 2024-06-28 ENCOUNTER — Encounter: Payer: Self-pay | Admitting: Clinical

## 2024-07-13 ENCOUNTER — Ambulatory Visit: Admitting: Clinical

## 2024-07-13 DIAGNOSIS — F4321 Adjustment disorder with depressed mood: Secondary | ICD-10-CM | POA: Diagnosis not present

## 2024-07-13 NOTE — Progress Notes (Unsigned)
 [facility] Counselor Progress Note - TELEMEDICINE VISIT  Patient ID: Stacey Byrd, MRN: [id number]    Date: 07/13/2024  Time Spent: 12:04pm  - 12:47  : 43 Minutes  Types of Service: Individual psychotherapy and Video visit  Client and/or Legal Guardian location: Work Therapist location: [facility] All persons participating in visit: Client & this therapist  I connected with client and/or legal guardian via Video Enabled Telemedicine Application  (Video is Caregility application) and verified that I am speaking with the correct person using two identifiers. Discussed confidentiality: Yes   I discussed the limitations of telemedicine and the availability of in person appointments.  Discussed there is a possibility of technology failure and discussed alternative modes of communication if that failure occurs.  I discussed that engaging in this telemedicine visit, they consent to the provision of behavioral healthcare and the services will be billed under their insurance.  Client and/or legal guardian expressed understanding and consented to Telemedicine visit: Yes    Presenting Concerns: Feeling overwhelmed by current classes  Mental Status Exam: Appearance:  Casual     Behavior: Appropriate  Motor: Normal  Speech/Language:  Normal Rate  Affect: Appropriate  Mood: anxious and sad  Thought process: normal  Thought content:   WNL  Sensory/Perceptual disturbances:   WNL  Orientation: oriented to person, place, time/date, and situation  Attention: Good  Concentration: Good  Memory: WNL  Fund of knowledge:  Good  Insight:   Good  Judgment:  Good  Impulse Control: Good   Risk Assessment: Danger to Self:  No Self-injurious Behavior: No Danger to Others: No Duty to Warn:no   Subjective:  Stacey Byrd presented to be alert and verbalized feeling overwhelmed with current class work, not understanding the information and expected project.   Interventions:  Cognitive Behavioral Therapy and Mindfulness Meditation  Client Response: Stacey Byrd was able to identify her thoughts, feelings & actions about the situation.  She is not doing well in her current classes and she needs to pass them in order to complete the program.  She reported this past Monday was the final day to drop the course and she chose to continue since she dropped them last time.    Stacey Byrd was able to identify alternative thoughts & actions to help her feel less overwhelmed.  She identified strengths and previous accomplishments that can encourage her to manage the current situation.  Stacey Byrd was also open to spending a few minutes to do a mindfulness exercise outside and eat something for lunch to also help her feel better.   Diagnosis:  Adjustment disorder with depressed mood   Goals, Assessment & Plan:   Continue with individual therapy  Treatment Level Biweekly  Modality - TeleMedicine - Video  Frequency: bi-weekly  Modality: individual - Virtual      Goal: Identify values that are important to her as evidenced by self-report. Stacey Byrd reported that completing the classes were important to her to help her in her career goals and she doesn't want to be seen as someone who gives up.   Target Date: 07/14/24  Progress: Completed      Goal: Increase knowledge of coping skills - Increase knowledge on unhealthy thinking habits (CBT) - Today, Stacey Byrd was able to identify thoughts and learn to challenge unhelpful thoughts that increase her stress level. Stacey Byrd will also implement one action item today to feel less overwhelmed, eg mindfulness activity    Target Date: 09/19/2024  Progress: Ongoing  Stacey Byrd, MSW, LCSW [employer] Therapist Main Office: [phone]

## 2024-07-25 ENCOUNTER — Other Ambulatory Visit: Payer: Self-pay | Admitting: Obstetrics and Gynecology

## 2024-07-25 DIAGNOSIS — N63 Unspecified lump in unspecified breast: Secondary | ICD-10-CM

## 2024-07-25 DIAGNOSIS — R921 Mammographic calcification found on diagnostic imaging of breast: Secondary | ICD-10-CM

## 2024-07-28 ENCOUNTER — Ambulatory Visit: Admitting: Clinical

## 2024-07-28 ENCOUNTER — Encounter: Payer: Self-pay | Admitting: Clinical

## 2024-07-28 DIAGNOSIS — F4321 Adjustment disorder with depressed mood: Secondary | ICD-10-CM

## 2024-07-28 NOTE — Progress Notes (Signed)
 [facility] Counselor Progress Note - TELEMEDICINE VISIT  Patient ID: Stacey Byrd, MRN: [id number]    Date: 07/28/2024  Time Spent: 12:02pm  - 12:53  : 51 Minutes  Types of Service: Individual psychotherapy and Video visit  Client and/or Legal Guardian location: Work Therapist location: [facility] office All persons participating in visit: Client & this therapist  I connected with client and/or legal guardian via Engineer, civil (consulting)  (Video is Surveyor, mining) and verified that I am speaking with the correct person using two identifiers. Discussed confidentiality: Yes   I discussed the limitations of telemedicine and the availability of in person appointments.  Discussed there is a possibility of technology failure and discussed alternative modes of communication if that failure occurs.  I discussed that engaging in this telemedicine visit, they consent to the provision of behavioral healthcare and the services will be billed under their insurance.  Client and/or legal guardian expressed understanding and consented to Telemedicine visit: Yes    Presenting Concerns: Concerns about her future and feeling accepted in work environments  Mental Status Exam: Appearance:  Well Groomed     Behavior: Appropriate  Motor: Normal  Speech/Language:  Normal Rate  Affect: Appropriate  Mood: anxious  Thought process: normal  Thought content:   WNL  Sensory/Perceptual disturbances:   WNL  Orientation: oriented to person, place, time/date, and situation  Attention: Good  Concentration: Good  Memory: WNL  Fund of knowledge:  Good  Insight:   Fair  Judgment:  Good  Impulse Control: Good   Risk Assessment: Danger to Self:  No Self-injurious Behavior: No Danger to Others: No Duty to Warn:no   Subjective:  Stacey Byrd reported that last night was difficult since she stayed up late completing all the assignments for her classes.     Interventions: Cognitive Behavioral Therapy and Assertiveness/Communication- Identifying thoughts, feelings & actions that can help improve her self-confidence and feeling more included in her work environment.  Client Response: Stacey Byrd completed her classes and had a sense of relief in completing both of them.  She will find out about her grades tomorrow, which will affect her future. She acknowledged that no matter what the results are, she felt she made a good decision to complete the classes and not give up, even when she wanted to.    She was open to looking at situations from a different perspective and being able to change her thoughts/actions to improve her mood and/or self-confidence, starting in her work environment.  Diagnosis:  Adjustment disorder with depressed mood   Goals, Assessment & Plan:   Stacey Byrd acknowledged her accomplishments and open to other perspectives to change her thoughts or behaviors.  Treatment Level 2-3 times a month  Modality - TeleMedicine - Video   Goal: Increase knowledge of coping skills - Increase knowledge on unhealthy thinking habits (CBT) - Stacey Byrd practiced applying cognitive coping skills to her current situation with her classes, relationships and work environment.     Target Date: 09/19/2024  Progress: Ongoing     Stacey Byrd P. Trudy, MSW, LCSW [employer] Therapist Main Office: [phone]

## 2024-08-04 ENCOUNTER — Other Ambulatory Visit (HOSPITAL_COMMUNITY): Payer: Self-pay

## 2024-08-10 ENCOUNTER — Other Ambulatory Visit: Payer: Self-pay

## 2024-08-10 MED ORDER — FLUZONE 0.5 ML IM SUSY
0.5000 mL | PREFILLED_SYRINGE | Freq: Once | INTRAMUSCULAR | 0 refills | Status: AC
  Filled 2024-08-10: qty 0.5, 1d supply, fill #0

## 2024-08-11 ENCOUNTER — Other Ambulatory Visit: Payer: Self-pay

## 2024-08-11 ENCOUNTER — Ambulatory Visit (INDEPENDENT_AMBULATORY_CARE_PROVIDER_SITE_OTHER): Admitting: Clinical

## 2024-08-11 DIAGNOSIS — F4321 Adjustment disorder with depressed mood: Secondary | ICD-10-CM

## 2024-08-11 NOTE — Progress Notes (Signed)
 [facility] Counselor Progress Note - TELEMEDICINE VISIT  Patient ID: Stacey Byrd, MRN: [id number]    Date: 08/11/2024  Time Spent: 12:01pm   - 12:55  : 54 Minutes  Types of Service: Individual psychotherapy  Client and/or Legal Guardian location: [place], [place] Therapist location: [facility] All persons participating in visit: Client & this therapist  I connected with client and/or legal guardian via Video Enabled Telemedicine Application  (Video is Caregility application) and verified that I am speaking with the correct person using two identifiers. Discussed confidentiality: Yes   I discussed the limitations of telemedicine and the availability of in person appointments.  Discussed there is a possibility of technology failure and discussed alternative modes of communication if that failure occurs.  I discussed that engaging in this telemedicine visit, they consent to the provision of behavioral healthcare and the services will be billed under their insurance.  Client and/or legal guardian expressed understanding and consented to Telemedicine visit: Yes    Presenting Concerns:  - feeling down today and difficulties with self-confidence  Mental Status Exam: Appearance:  Neat     Behavior: Appropriate  Motor: Normal  Speech/Language:  Normal Rate  Affect: Appropriate  Mood: depressed  Thought process: normal  Thought content:   WNL  Sensory/Perceptual disturbances:   WNL  Orientation: oriented to person, place, time/date, situation, and day of week  Attention: Good  Concentration: Good  Memory: WNL  Fund of knowledge:  Good  Insight:   Good  Judgment:  Good  Impulse Control: Good   Risk Assessment: Danger to Self:  No Self-injurious Behavior: No Danger to Others: No Duty to Warn:no   Subjective:  Stacey Byrd reported feeling down, depressed and unmotivated today. She also reported feeling excluded in various aspects of her life, which leads her  to negative/unhelpful thoughts about herself.  Interventions: Cognitive Behavioral Therapy - Reviewed and practiced challenging unhelpful thinking habits & identifying alternative thoughts or actions.  Client Response: Stacey Byrd reported that she passed her classes and waiting on her certificate.  She reported she's been thinking more about other things that is making her feel depressed, since she's not preoccupied in studying for her classes.  Stacey Byrd was open to challenging & re-framing unhelpful thinking habits in order to improve her mood and self-confidence.  Diagnosis:  Adjustment disorder with depressed mood   Goals, Assessment & Plan:   Stacey Byrd continues to increase her cognitive coping skills to improve her mood and change her actions.  Treatment Level 2-3 times a month of outpatient individual psycho therapy  Modality - TeleMedicine - Video  Goal: Increase knowledge of coping skills - Increase knowledge on unhealthy thinking habits (CBT) - Stacey Byrd actively participated in identifying thoughts, feelings & actions about different situations in her life.   Target Date: 09/19/2024  Progress: Ongoing     Objective: Increase ability to stop and think about the action she is doing to enhance self-control & motivation for self-care activities. Stacey Byrd will work on waiting to make simple decisions and identify the why or reasons behind the decision she is making, eg, why she wants to eat a particular food or why she doesn't want to walk  Target Date: 09/19/2024  Progress: Ongoing    Stacey Byrd P. Trudy, MSW, LCSW [employer] Therapist Main Office: [phone]

## 2024-08-15 ENCOUNTER — Ambulatory Visit (INDEPENDENT_AMBULATORY_CARE_PROVIDER_SITE_OTHER): Admitting: Family

## 2024-08-15 ENCOUNTER — Ambulatory Visit: Payer: Self-pay | Admitting: Family

## 2024-08-15 ENCOUNTER — Other Ambulatory Visit (HOSPITAL_COMMUNITY)
Admission: RE | Admit: 2024-08-15 | Discharge: 2024-08-15 | Disposition: A | Discharge status: Home / Self Care | Source: Ambulatory Visit | Attending: Family | Admitting: Family

## 2024-08-15 ENCOUNTER — Other Ambulatory Visit: Payer: Self-pay

## 2024-08-15 ENCOUNTER — Encounter: Payer: Self-pay | Admitting: Family

## 2024-08-15 VITALS — BP 122/86 | HR 102 | Temp 98.5°F | Resp 16 | Ht 65.0 in | Wt 222.0 lb

## 2024-08-15 DIAGNOSIS — Z7984 Long term (current) use of oral hypoglycemic drugs: Secondary | ICD-10-CM | POA: Diagnosis not present

## 2024-08-15 DIAGNOSIS — E559 Vitamin D deficiency, unspecified: Secondary | ICD-10-CM

## 2024-08-15 DIAGNOSIS — R399 Unspecified symptoms and signs involving the genitourinary system: Secondary | ICD-10-CM

## 2024-08-15 DIAGNOSIS — B3731 Acute candidiasis of vulva and vagina: Secondary | ICD-10-CM

## 2024-08-15 DIAGNOSIS — B9689 Other specified bacterial agents as the cause of diseases classified elsewhere: Secondary | ICD-10-CM

## 2024-08-15 DIAGNOSIS — E1165 Type 2 diabetes mellitus with hyperglycemia: Secondary | ICD-10-CM

## 2024-08-15 DIAGNOSIS — R5383 Other fatigue: Secondary | ICD-10-CM | POA: Diagnosis not present

## 2024-08-15 DIAGNOSIS — E119 Type 2 diabetes mellitus without complications: Secondary | ICD-10-CM

## 2024-08-15 DIAGNOSIS — N898 Other specified noninflammatory disorders of vagina: Secondary | ICD-10-CM

## 2024-08-15 DIAGNOSIS — Z0189 Encounter for other specified special examinations: Secondary | ICD-10-CM

## 2024-08-15 DIAGNOSIS — Z7689 Persons encountering health services in other specified circumstances: Secondary | ICD-10-CM

## 2024-08-15 DIAGNOSIS — Z1321 Encounter for screening for nutritional disorder: Secondary | ICD-10-CM

## 2024-08-15 LAB — POCT URINALYSIS DIP (CLINITEK)
Bilirubin, UA: NEGATIVE
Glucose, UA: 500 mg/dL — AB
Leukocytes, UA: NEGATIVE
Nitrite, UA: NEGATIVE
POC PROTEIN,UA: NEGATIVE
Spec Grav, UA: 1.02 (ref 1.010–1.025)
Urobilinogen, UA: 0.2 U/dL
pH, UA: 5.5 (ref 5.0–8.0)

## 2024-08-15 MED ORDER — METFORMIN HCL ER 500 MG PO TB24
500.0000 mg | ORAL_TABLET | Freq: Two times a day (BID) | ORAL | 0 refills | Status: DC
  Filled 2024-08-15: qty 180, 90d supply, fill #0

## 2024-08-15 MED ORDER — ACCU-CHEK GUIDE W/DEVICE KIT
1.0000 | PACK | Freq: Three times a day (TID) | 0 refills | Status: AC
  Filled 2024-08-15: qty 1, 30d supply, fill #0

## 2024-08-15 MED ORDER — ACCU-CHEK SOFTCLIX LANCETS MISC
1.0000 | Freq: Three times a day (TID) | 12 refills | Status: AC
  Filled 2024-08-15: qty 100, 25d supply, fill #0

## 2024-08-15 MED ORDER — ACCU-CHEK GUIDE TEST VI STRP
1.0000 | ORAL_STRIP | Freq: Three times a day (TID) | 12 refills | Status: AC
  Filled 2024-08-15 (×2): qty 100, 25d supply, fill #0

## 2024-08-15 NOTE — Progress Notes (Signed)
 Patient thinks her blood sugar is up, and needs her vitamin d  check, patient thinks she may have a yeast infection

## 2024-08-15 NOTE — Progress Notes (Signed)
 Subjective:    Stacey Byrd - 43 y.o. female MRN [id number]  Date of birth: 01/28/1981  HPI  Stacey Byrd is to establish care.   Current issues and/or concerns: - Type 2 diabetes. Doing well on Metformin  XR, no issues/concerns. States she is no longer taking Pioglitazone  due to no longer needing. She does not watch what she eats. She does not exercise. Needs diabetic testing supplies. Denies red flag symptoms associated with diabetes. Requests referral to nutritionist. - States up to date on diabetic eye exam. - Due for diabetic foot exam. - Fatigue.  - Requests Vitamin D  lab. - States frequent and urgent urination.  - States thinks she has a yeast infection due to vaginal itching.   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 She has never used smokeless tobacco. She reports current alcohol use. She reports that she does not use drugs.   Family History  family history includes Crohn's disease in her mother; Diabetes in her mother; Gout in her mother; Hyperlipidemia in her father, maternal grandmother, and mother; Hypertension in her father and mother; Kidney Stones in her brother; Kidney disease in her mother; Osteoporosis in her mother; Seizures in her father; Stroke (age of onset: 55) in her father.   Medications:  reviewed and updated   Objective:   Physical Exam BP 122/86   Pulse (!) 102   Temp 98.5 F (36.9 C) (Oral)   Resp 16   Ht 5' 5 (1.651 m)   Wt 222 lb (100.7 kg)   LMP 07/20/2024 (Approximate)   SpO2 98%   BMI 36.94 kg/m   Physical Exam HENT:     Head: Normocephalic and atraumatic.     Nose: Nose normal.     Mouth/Throat:     Mouth: Mucous membranes are moist.     Pharynx: Oropharynx is clear.  Eyes:     Extraocular Movements: Extraocular movements intact.     Conjunctiva/sclera: Conjunctivae normal.     Pupils: Pupils are equal, round, and reactive to light.  Cardiovascular:     Rate and Rhythm: Tachycardia present.     Pulses: Normal pulses.     Heart sounds: Normal heart sounds.  Pulmonary:     Effort: Pulmonary effort is normal.     Breath sounds: Normal breath sounds.  Musculoskeletal:        General: Normal range of motion.     Cervical back: Normal range of motion and neck supple.  Neurological:     General: No focal deficit present.     Mental Status: She is alert and oriented to person, place, and time.  Psychiatric:        Mood and Affect: Mood normal.  Behavior: Behavior normal.     Assessment & Plan:  1. Encounter to establish care (Primary) - Patient presents today to establish care. During the interim follow-up with primary provider as scheduled.  - Return for annual physical examination, labs, and health maintenance. Arrive fasting meaning having no food for at least 8 hours prior to appointment. You may have only water or black coffee. Please take scheduled medications as normal.  2. Type 2 diabetes mellitus with hyperglycemia, without long-term current use of insulin (HCC) - Hemoglobin A1c result pending.  - Continue Metformin  XR as prescribed. - Routine screening.  - Diabetic testing supplies prescribed.  - Discussed the importance of healthy eating habits, low-carbohydrate diet, low-sugar diet, regular aerobic exercise (at least 150  minutes a week as tolerated) and medication compliance to achieve or maintain control of diabetes. Counseled on medication adherence/adverse effects. - Referral to Medical Nutrition Therapy for evaluation/management. - Follow-up with primary provider as scheduled.  - metFORMIN  (GLUCOPHAGE -XR) 500 MG 24 hr tablet; Take 1 tablet (500 mg total) by mouth 2 (two) times daily.  Dispense: 180 tablet; Refill: 0 - Microalbumin / creatinine urine ratio - CMP14+EGFR - Amb ref to Medical Nutrition Therapy-MNT - Hemoglobin A1c - Blood Glucose Monitoring Suppl (TRUE METRIX METER) w/Device KIT; 1 kit by Other route 4 (four) times daily -  before meals and at bedtime. Use as directed  Dispense: 1 kit; Refill: 0 - glucose blood (TRUE METRIX BLOOD GLUCOSE TEST) test strip; 1 each by Other route 4 (four) times daily -  before meals and at bedtime. Use as instructed  Dispense: 100 each; Refill: 12 - TRUEplus Lancets 28G MISC; 1 each by Other route 4 (four) times daily -  before meals and at bedtime. Use as directed  Dispense: 100 each; Refill: 12  3. Diabetic eye exam [facility]) - Patient report up to date on diabetic eye exam.  4. Encounter for diabetic foot exam [facility]) - Referral to Podiatry for evaluation/management. - Ambulatory referral to Podiatry  5. Fatigue, unspecified type - Routine screening.  - TSH - CBC  6. Encounter for vitamin deficiency screening - Routine screening.  - Vitamin D , 25-hydroxy  7. Lower urinary tract symptoms (LUTS) - Routine screening.  - Urine Culture - POCT URINALYSIS DIP (CLINITEK); Future  8. Vaginal itching - Routine screening.  - Cervicovaginal ancillary only   Patient was given clear instructions to go to Emergency Department or return to medical center if symptoms don't improve, worsen, or new problems develop.The patient verbalized understanding.  I discussed the assessment and treatment plan with the patient. The patient was provided an opportunity to ask  questions and all were answered. The patient agreed with the plan and demonstrated an understanding of the instructions.   The patient was advised to call back or seek an in-person evaluation if the symptoms worsen or if the condition fails to improve as anticipated.    Greig Drones, NP 08/15/2024, 9:03 AM Primary Care at [facility]

## 2024-08-16 ENCOUNTER — Telehealth: Payer: Self-pay | Admitting: Emergency Medicine

## 2024-08-16 LAB — CERVICOVAGINAL ANCILLARY ONLY
Bacterial Vaginitis (gardnerella): POSITIVE — AB
Candida Glabrata: NEGATIVE
Candida Vaginitis: POSITIVE — AB
Chlamydia: NEGATIVE
Comment: NEGATIVE
Comment: NEGATIVE
Comment: NEGATIVE
Comment: NEGATIVE
Comment: NEGATIVE
Comment: NORMAL
Neisseria Gonorrhea: NEGATIVE
Trichomonas: NEGATIVE

## 2024-08-16 LAB — CBC
Hematocrit: 43.6 % (ref 34.0–46.6)
Hemoglobin: 13.4 g/dL (ref 11.1–15.9)
MCH: 27.5 pg (ref 26.6–33.0)
MCHC: 30.7 g/dL — ABNORMAL LOW (ref 31.5–35.7)
MCV: 89 fL (ref 79–97)
Platelets: 336 x10E3/uL (ref 150–450)
RBC: 4.88 x10E6/uL (ref 3.77–5.28)
RDW: 12.1 % (ref 11.7–15.4)
WBC: 6.1 x10E3/uL (ref 3.4–10.8)

## 2024-08-16 LAB — CMP14+EGFR
ALT: 26 IU/L (ref 0–32)
AST: 23 IU/L (ref 0–40)
Albumin: 4.6 g/dL (ref 3.9–4.9)
Alkaline Phosphatase: 154 IU/L — ABNORMAL HIGH (ref 41–116)
BUN/Creatinine Ratio: 14 (ref 9–23)
BUN: 12 mg/dL (ref 6–24)
Bilirubin Total: 0.4 mg/dL (ref 0.0–1.2)
CO2: 17 mmol/L — ABNORMAL LOW (ref 20–29)
Calcium: 9.4 mg/dL (ref 8.7–10.2)
Chloride: 99 mmol/L (ref 96–106)
Creatinine, Ser: 0.84 mg/dL (ref 0.57–1.00)
Globulin, Total: 2.9 g/dL (ref 1.5–4.5)
Glucose: 354 mg/dL — ABNORMAL HIGH (ref 70–99)
Potassium: 4.8 mmol/L (ref 3.5–5.2)
Sodium: 134 mmol/L (ref 134–144)
Total Protein: 7.5 g/dL (ref 6.0–8.5)
eGFR: 88 mL/min/1.73 (ref >59)

## 2024-08-16 LAB — TSH: TSH: 1.03 u[IU]/mL (ref 0.450–4.500)

## 2024-08-16 LAB — VITAMIN D 25 HYDROXY (VIT D DEFICIENCY, FRACTURES): Vit D, 25-Hydroxy: 25.2 ng/mL — ABNORMAL LOW (ref 30.0–100.0)

## 2024-08-16 LAB — HEMOGLOBIN A1C
Est. average glucose Bld gHb Est-mCnc: 309 mg/dL
Hgb A1c MFr Bld: 12.4 % — ABNORMAL HIGH (ref 4.8–5.6)

## 2024-08-16 MED ORDER — VITAMIN D (ERGOCALCIFEROL) 1.25 MG (50000 UNIT) PO CAPS: 50000.0000 [IU] | ORAL_CAPSULE | ORAL | 0 refills | Status: AC

## 2024-08-16 MED ORDER — PEN NEEDLES 31G X 8 MM MISC
1.0000 | Freq: Every day | 12 refills | Status: AC
  Filled 2024-08-25: qty 100, 100d supply, fill #0

## 2024-08-16 MED ORDER — INSULIN GLARGINE 100 UNIT/ML SOLOSTAR PEN
10.0000 [IU] | PEN_INJECTOR | Freq: Every day | SUBCUTANEOUS | 0 refills | Status: DC
  Filled 2024-08-18: qty 9, 84d supply, fill #0

## 2024-08-16 NOTE — Telephone Encounter (Signed)
 Copied from CRM [id number]. Topic: Clinical - Lab/Test Results >> Aug 16, 2024 11:26 AM Sophia H wrote: Reason for CRM: Returning Essance Gatti's phone call regarding labs. Called [facility] but no answer, please reach out when you get a chance. Ty # [id number]  I spoke with patient and gave her results and recommendations

## 2024-08-17 ENCOUNTER — Other Ambulatory Visit: Payer: Self-pay

## 2024-08-17 LAB — MICROALBUMIN / CREATININE URINE RATIO
Creatinine, Urine: 77 mg/dL
Microalb/Creat Ratio: 6 mg/g{creat} (ref 0–29)
Microalbumin, Urine: 4.5 ug/mL

## 2024-08-17 LAB — URINE CULTURE

## 2024-08-17 MED ORDER — METRONIDAZOLE 500 MG PO TABS: 500.0000 mg | ORAL_TABLET | Freq: Two times a day (BID) | ORAL | 0 refills | Status: AC

## 2024-08-17 MED ORDER — FLUCONAZOLE 150 MG PO TABS: 150.0000 mg | ORAL_TABLET | ORAL | 0 refills | Status: AC

## 2024-08-18 ENCOUNTER — Other Ambulatory Visit: Payer: Self-pay

## 2024-08-19 ENCOUNTER — Other Ambulatory Visit: Payer: Self-pay

## 2024-08-23 ENCOUNTER — Encounter: Payer: Self-pay | Admitting: Family

## 2024-08-25 ENCOUNTER — Other Ambulatory Visit: Payer: Self-pay

## 2024-08-25 ENCOUNTER — Ambulatory Visit (INDEPENDENT_AMBULATORY_CARE_PROVIDER_SITE_OTHER): Admitting: Clinical

## 2024-08-25 ENCOUNTER — Encounter: Payer: Self-pay | Admitting: Clinical

## 2024-08-25 DIAGNOSIS — F4321 Adjustment disorder with depressed mood: Secondary | ICD-10-CM

## 2024-08-25 NOTE — Progress Notes (Signed)
 [facility] Counselor Progress Note - TELEMEDICINE VISIT  Patient ID: Stacey Byrd, MRN: [id number]    Date: 08/25/2024  Time Spent: 12pm   - 12:57pm  : 57 Minutes  Types of Service: Individual psychotherapy and Video visit  Client and/or Legal Guardian location: [place], [place] Therapist location: [facility] [place], [place] All persons participating in visit: Client & this therapist  I connected with client and/or legal guardian via Video Enabled Telemedicine Application  (Video is Caregility application) and verified that I am speaking with the correct person using two identifiers. Discussed confidentiality: Yes   I discussed the limitations of telemedicine and the availability of in person appointments.  Discussed there is a possibility of technology failure and discussed alternative modes of communication if that failure occurs.  I discussed that engaging in this telemedicine visit, they consent to the provision of behavioral healthcare and the services will be billed under their insurance.  Client and/or legal guardian expressed understanding and consented to Telemedicine visit: Yes    Presenting Concerns: - Trying to work on changing patterns that are negatively impacting her thoughts & behaviors.  Mental Status Exam: Appearance:  Neat     Behavior: Appropriate  Motor: Normal  Speech/Language:  Normal Rate  Affect: Appropriate  Mood: normal  Thought process: normal  Thought content:   WNL  Sensory/Perceptual disturbances:   WNL  Orientation: oriented to person, place, time/date, situation, and day of week  Attention: Good  Concentration: Good  Memory: WNL  Fund of knowledge:  Good  Insight:   Good  Judgment:  Good  Impulse Control: Fair   Risk Assessment: Danger to Self:  No Self-injurious Behavior: No Danger to Others: No Duty to Warn:no   Subjective:  Stacey Byrd reported she's feeling ok today compared to the last couple days.    She also is becoming more motivated to change her thought & behavioral patterns to improve her habits & relationships  Interventions: Cognitive Behavioral Therapy  - identifying thoughts, actions and behaviors that is affecting her daily life and interactions   Client Response: After exploring what helped her feel better today, she reported she slept well last night after a hard couple days.  Stacey Byrd also shared the changes she's made to improve her health habits and the way she thinks about herself.  Diagnosis:  Adjustment disorder with depressed mood   Goals, Assessment & Plan:   Continue individual psycho therapy to learn and implement cognitive coping skills and improve health habits.  Treatment Level 2-3 times a month of outpatient individual psycho therapy   Modality - TeleMedicine - Video   Goal: Increase knowledge of coping skills - Increase knowledge on unhealthy thinking habits (CBT)   Target Date: 09/19/2024  Progress: Ongoing     Objective: Increase ability to stop and think about the action she is doing to enhance self-control & motivation for self-care activities.  Being more intentional in cutting back on sugary drinks and desserts She was open to practicing mindful eating    Target Date: 09/19/2024  Progress: Ongoing      Stacey Byrd P. Trudy, MSW, LCSW [employer] Therapist Main Office: [phone]

## 2024-08-31 ENCOUNTER — Encounter: Payer: Self-pay | Admitting: Skilled Nursing Facility1

## 2024-08-31 ENCOUNTER — Encounter: Attending: Family | Admitting: Skilled Nursing Facility1

## 2024-08-31 DIAGNOSIS — E119 Type 2 diabetes mellitus without complications: Secondary | ICD-10-CM | POA: Insufficient documentation

## 2024-08-31 NOTE — Progress Notes (Signed)
 A1C 12.4  DM Medications: Lantus 10 units one time a day typically around 6-7pm  Metformin    Pt states she does check her blood sugars: checking 1-2 times a day: 180, 202; sometimes mid day before a meal 250, 156, 230, 181, 198 Wakes around 6am eating around 10-10:30 am.  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  Pt states she enjoys healthy food.  Pt states she has been drinking more water and cutting back on soda and kool aid.  Pt states there is a gym at her job.  Goals: Brush your teeth 2 times a day 7 days a week Floss Daily Check your feet daily looking for anything that was not there the day before  Be sure to take your insulin at around the same time every evening  Go to the gym after work 3 days a week; walking    Diabetes Self-Management Education  Visit Type: First/Initial  Appt. Start Time: 4:40  Appt. End Time: 5:40  08/31/2024  Ms. Stacey Byrd, identified by name and date of birth, is a 43 y.o. female with a diagnosis of Diabetes: Type 2.   ASSESSMENT  Last menstrual period 07/20/2024. There is no height or weight on file to calculate BMI.   Diabetes Self-Management Education - 08/31/24 1646       Visit Information   Visit Type First/Initial      Initial Visit   Diabetes Type Type 2    Are you currently following a meal plan? No    Are you taking your medications as prescribed? Yes      Health Coping   How would you rate your overall health? Good      Psychosocial Assessment   What is the hardest part about your diabetes right now, causing you the most concern, or is the most worrisome to you about your diabetes?   Taking/obtaining medications    Self-care barriers None    Self-management support Family;Friends    Patient Concerns Nutrition/Meal planning;Healthy Lifestyle    Special Needs None    Preferred Learning Style Visual;Hands on    Learning Readiness Contemplating    How often do you need to have someone help you when you read  instructions, pamphlets, or other written materials from your doctor or pharmacy? 1 - Never      Pre-Education Assessment   Patient understands the diabetes disease and treatment process. Needs Instruction    Patient understands incorporating nutritional management into lifestyle. Needs Instruction    Patient undertands incorporating physical activity into lifestyle. Needs Instruction    Patient understands using medications safely. Needs Instruction    Patient understands monitoring blood glucose, interpreting and using results Needs Instruction    Patient understands prevention, detection, and treatment of acute complications. Needs Instruction    Patient understands prevention, detection, and treatment of chronic complications. Needs Instruction    Patient understands how to develop strategies to address psychosocial issues. Needs Instruction    Patient understands how to develop strategies to promote health/change behavior. Needs Instruction      Complications   Last HgB A1C per patient/outside source 12.4 %    How often do you check your blood sugar? 1-2 times/day    Fasting Blood glucose range (mg/dL) >799;819-799    Postprandial Blood glucose range (mg/dL) 819-799;>799    Number of hypoglycemic episodes per month 0    Number of hyperglycemic episodes ( >200mg /dL): Daily    Can you tell when your blood sugar is high? No  Have you had a dilated eye exam in the past 12 months? Yes    Have you had a dental exam in the past 12 months? No    Are you checking your feet? No      Dietary Intake   Breakfast skipped or granola bar with apple and peanut butter or pancake and bacon    Lunch 1pm: frozen meal + corn + applesauce    Snack (afternoon) chips or peanut butter crackers    Dinner chicken + potato salad + greens or pizza and french fries    Snack (evening) dessert or french fry and milkshake    Beverage(s) water, kool aid, soda, tea      Activity / Exercise   Activity / Exercise  Type ADL's    How many days per week do you exercise? 0    How many minutes per day do you exercise? 0    Total minutes per week of exercise 0      Patient Education   Previous Diabetes Education No    Disease Pathophysiology Definition of diabetes, type 1 and 2, and the diagnosis of diabetes;Factors that contribute to the development of diabetes    Healthy Eating Role of diet in the treatment of diabetes and the relationship between the three main macronutrients and blood glucose level;Food label reading, portion sizes and measuring food.;Plate Method;Carbohydrate counting;Effects of alcohol on blood glucose and safety factors with consumption of alcohol.;Meal timing in regards to the patients' current diabetes medication.    Being Active Role of exercise on diabetes management, blood pressure control and cardiac health.;Identified with patient nutritional and/or medication changes necessary with exercise.    Medications Reviewed patients medication for diabetes, action, purpose, timing of dose and side effects.;Taught/reviewed insulin/injectables, injection, site rotation, insulin/injectables storage and needle disposal.;Reviewed medication adjustment guidelines for hyperglycemia and sick days.    Monitoring Taught/evaluated SMBG meter.;Purpose and frequency of SMBG.;Daily foot exams;Yearly dilated eye exam;Interpreting lab values - A1C, lipid, urine microalbumina.    Acute complications Taught prevention, symptoms, and  treatment of hypoglycemia - the 15 rule.;Discussed and identified patients' prevention, symptoms, and treatment of hyperglycemia.    Chronic complications Relationship between chronic complications and blood glucose control;Assessed and discussed foot care and prevention of foot problems;Identified and discussed with patient  current chronic complications;Lipid levels, blood glucose control and heart disease;Dental care;Retinopathy and reason for yearly dilated eye exams;Nephropathy,  what it is, prevention of, the use of ACE, ARB's and early detection of through urine microalbumia.    Diabetes Stress and Support Identified and addressed patients feelings and concerns about diabetes;Role of stress on diabetes    Lifestyle and Health Coping Lifestyle issues that need to be addressed for better diabetes care      Individualized Goals (developed by patient)   Nutrition Follow meal plan discussed;General guidelines for healthy choices and portions discussed    Physical Activity Exercise 1-2 times per week;15 minutes per day;30 minutes per day    Medications take my medication as prescribed    Monitoring  Test my blood glucose as discussed;Test blood glucose pre and post meals as discussed    Problem Solving Eating Pattern    Reducing Risk do foot checks daily;treat hypoglycemia with 15 grams of carbs if blood glucose less than 70mg /dL      Post-Education Assessment   Patient understands the diabetes disease and treatment process. Demonstrates understanding / competency    Patient understands incorporating nutritional management into lifestyle. Demonstrates understanding / competency  Patient undertands incorporating physical activity into lifestyle. Demonstrates understanding / competency    Patient understands using medications safely. Demonstrates understanding / competency    Patient understands monitoring blood glucose, interpreting and using results Demonstrates understanding / competency    Patient understands prevention, detection, and treatment of acute complications. Demonstrates understanding / competency    Patient understands prevention, detection, and treatment of chronic complications. Demonstrates understanding / competency    Patient understands how to develop strategies to address psychosocial issues. Demonstrates understanding / competency    Patient understands how to develop strategies to promote health/change behavior. Demonstrates understanding /  competency      Outcomes   Expected Outcomes Demonstrated interest in learning. Expect positive outcomes    Future DMSE 4-6 wks    Program Status Completed          Individualized Plan for Diabetes Self-Management Training:   Learning Objective:  Patient will have a greater understanding of diabetes self-management. Patient education plan is to attend individual and/or group sessions per assessed needs and concerns.    Expected Outcomes:  Demonstrated interest in learning. Expect positive outcomes  Education material provided: ADA - How to Thrive: A Guide for Your Journey with Diabetes, Food label handouts, A1C conversion sheet, Meal plan card, My Plate, and Snack sheet  If problems or questions, patient to contact team via:  Phone and Email  Future DSME appointment: 4-6 wks

## 2024-09-01 ENCOUNTER — Telehealth: Payer: Self-pay | Admitting: Emergency Medicine

## 2024-09-01 ENCOUNTER — Encounter: Payer: Self-pay | Admitting: Family

## 2024-09-01 NOTE — Telephone Encounter (Signed)
 I called and spoke with Breonna at [facility] to see if they could see lab results from here in epic she stated she would check and return my call.

## 2024-09-02 NOTE — Unmapped External Note (Signed)
 Opened chart to complete task list.

## 2024-09-05 NOTE — Telephone Encounter (Signed)
 Copied from CRM #[id number]. Topic: Clinical - Lab/Test Results >> Sep 05, 2024  9:28 AM Myrick T wrote: Reason for CRM: patient returning Traundra's call about sending her lab results. Please return patients call

## 2024-09-06 ENCOUNTER — Telehealth: Payer: Self-pay | Admitting: Emergency Medicine

## 2024-09-06 ENCOUNTER — Other Ambulatory Visit: Payer: Self-pay | Admitting: Family

## 2024-09-06 NOTE — Telephone Encounter (Signed)
 I called patient and about lab results and she stated her mother would pick them up for her.

## 2024-09-07 ENCOUNTER — Telehealth: Payer: Self-pay | Admitting: Emergency Medicine

## 2024-09-07 ENCOUNTER — Ambulatory Visit: Admitting: Clinical

## 2024-09-07 DIAGNOSIS — F4321 Adjustment disorder with depressed mood: Secondary | ICD-10-CM | POA: Diagnosis not present

## 2024-09-07 NOTE — Telephone Encounter (Signed)
 Sab[facility] from [facility] call about patient labs she stated she could nit see anything but the A1C.  I made her aware patient pick up a copy of labs to bring with her to the appointment

## 2024-09-07 NOTE — Telephone Encounter (Signed)
Patient picked up paperwork today.

## 2024-09-07 NOTE — Progress Notes (Signed)
 [facility] Counselor Progress Note - TELEMEDICINE VISIT  Patient ID: Stacey Byrd, MRN: [id number]    Date: 09/07/2024  Time Spent: 1pm   - 1:57pm  : 57 Minutes  Types of Service: Individual psychotherapy and Video visit  Client and/or Legal Guardian location: [place], [place] Therapist location: [facility] [place], [place]  All persons participating in visit: Client & this therapist  I connected with client and/or legal guardian via Video Enabled Telemedicine Application  (Video is Caregility application) and verified that I am speaking with the correct person using two identifiers. Discussed confidentiality: Yes   I discussed the limitations of telemedicine and the availability of in person appointments.  Discussed there is a possibility of technology failure and discussed alternative modes of communication if that failure occurs.  I discussed that engaging in this telemedicine visit, they consent to the provision of behavioral healthcare and the services will be billed under their insurance.  Client and/or legal guardian expressed understanding and consented to Telemedicine visit: Yes    Presenting Concerns:  - Continuing to build self-confidence in all aspects of her life  Mental Status Exam: Appearance:  Neat     Behavior: Appropriate  Motor: Normal  Speech/Language:  Normal Rate  Affect: Appropriate  Mood: normal  Thought process: normal  Thought content:   WNL  Sensory/Perceptual disturbances:   WNL  Orientation: oriented to person, place, time/date, situation, and day of week  Attention: Good  Concentration: Good  Memory: WNL  Fund of knowledge:  Good  Insight:   Good  Judgment:  Good  Impulse Control: Good   Risk Assessment: Danger to Self:  No Self-injurious Behavior: No Danger to Others: No Duty to Warn:no   Subjective:  Stacey Byrd wants to continue to work on her self confidence and developing healthy habits, as well as being  more assertive.   Interventions: Cognitive Behavioral Therapy and Assertiveness/Communication  Client Response: Stacey Byrd identified recent accomplishments regarding her healthy habits, eg Monday - walked for 20 min, Tuesday walked 30 min.  She shared a situation in her current job about a comment engineer, site stated which seemed to discourage her from doing more things at work.  However, she was open to trying again to learn more things at her job so she will ask the Financial Analyst about assisting her with additional tasks & presenting her with a potential plan for training.  Stacey Byrd was able to identify situations when she felt the most confident in her self, eg last year in high school-dating boyfriend, wearing specific outfits.  She thins that when her dad left when she was 36 yo, it affected how she saw herself.  She had a dream about telling her dad what she wanted to say to him all these years.   Diagnosis:  Adjustment disorder with depressed mood   Goals, Assessment & Plan:   Stacey Byrd continues to be motivated to implement healthy coping strategies and habits to improve her health.  She continues to rebuild her self-confidence.  Treatment Level 2 times a month.  Modality - TeleMedicine - Video  Goal: Increase knowledge of coping skills - Increase knowledge on unhealthy thinking habits (CBT)  Objective: Increase ability to stop and think about the action she is doing to enhance self-control & motivation for self-care activities.   - Stacey Byrd is learning more self- control by not letting unhelpful or negative thoughts take over how she feels. She is doing more positive self-talk. Michelle Wnek will continue to  work on walking for physical activities. Stacey Byrd will also think about a plan to enhance her skill set at work.   Target Date: 12/18/2024  Progress: Ongoing     Melanie Openshaw P. Trudy, MSW, LCSW [employer] Therapist Main Office: [phone]

## 2024-09-08 ENCOUNTER — Ambulatory Visit: Admitting: Podiatry

## 2024-09-08 ENCOUNTER — Ambulatory Visit
Admission: RE | Admit: 2024-09-08 | Discharge: 2024-09-08 | Disposition: A | Discharge status: Home / Self Care | Source: Ambulatory Visit | Attending: Obstetrics and Gynecology | Admitting: Obstetrics and Gynecology

## 2024-09-08 ENCOUNTER — Other Ambulatory Visit: Payer: Self-pay

## 2024-09-08 ENCOUNTER — Ambulatory Visit

## 2024-09-08 ENCOUNTER — Encounter: Payer: Self-pay | Admitting: Podiatry

## 2024-09-08 DIAGNOSIS — E1165 Type 2 diabetes mellitus with hyperglycemia: Secondary | ICD-10-CM | POA: Diagnosis not present

## 2024-09-08 DIAGNOSIS — M21612 Bunion of left foot: Secondary | ICD-10-CM | POA: Diagnosis not present

## 2024-09-08 DIAGNOSIS — Q6689 Other  specified congenital deformities of feet: Secondary | ICD-10-CM

## 2024-09-08 DIAGNOSIS — R921 Mammographic calcification found on diagnostic imaging of breast: Secondary | ICD-10-CM | POA: Diagnosis not present

## 2024-09-08 DIAGNOSIS — M21619 Bunion of unspecified foot: Secondary | ICD-10-CM

## 2024-09-08 DIAGNOSIS — M21611 Bunion of right foot: Secondary | ICD-10-CM | POA: Diagnosis not present

## 2024-09-08 DIAGNOSIS — N63 Unspecified lump in unspecified breast: Secondary | ICD-10-CM

## 2024-09-08 DIAGNOSIS — N6325 Unspecified lump in the left breast, overlapping quadrants: Secondary | ICD-10-CM | POA: Diagnosis not present

## 2024-09-08 DIAGNOSIS — E119 Type 2 diabetes mellitus without complications: Secondary | ICD-10-CM

## 2024-09-08 DIAGNOSIS — Q72899 Other reduction defects of unspecified lower limb: Secondary | ICD-10-CM

## 2024-09-08 MED ORDER — METFORMIN HCL ER 500 MG PO TB24
ORAL_TABLET | ORAL | 2 refills | Status: AC
  Filled 2024-09-08: qty 180, 90d supply, fill #0

## 2024-09-08 MED ORDER — FREESTYLE LIBRE 3 PLUS SENSOR MISC
4 refills | Status: AC
  Filled 2024-09-08 – 2024-09-20 (×2): qty 2, 30d supply, fill #0
  Filled 2024-11-02: qty 2, 30d supply, fill #1

## 2024-09-08 MED ORDER — MOUNJARO 2.5 MG/0.5ML ~~LOC~~ SOAJ
SUBCUTANEOUS | 0 refills | Status: DC
  Filled 2024-09-08: qty 2, 28d supply, fill #0

## 2024-09-08 MED ORDER — INSULIN GLARGINE SOLOSTAR 100 UNIT/ML ~~LOC~~ SOPN
10.0000 [IU] | PEN_INJECTOR | Freq: Every day | SUBCUTANEOUS | 2 refills | Status: AC
  Filled 2024-09-08: qty 9, 90d supply, fill #0

## 2024-09-08 MED ORDER — MOUNJARO 5 MG/0.5ML ~~LOC~~ SOAJ
5.0000 mg | SUBCUTANEOUS | 3 refills | Status: DC
  Filled 2024-09-08: qty 2, 28d supply, fill #0

## 2024-09-08 NOTE — Progress Notes (Signed)
 Subjective:   Patient ID: Stacey Byrd, female   DOB: 43 y.o.   MRN: [id number]   HPI Patient presents with diabetes for several years and admits that her A1c has been very high at 12.  Also has foot structural pathology bilateral been present for a long time.  Patient does not smoke likes to be active   Review of Systems  All other systems reviewed and are negative.       Objective:  Physical Exam Vitals and nursing note reviewed.  Constitutional:      Appearance: She is well-developed.  Pulmonary:     Effort: Pulmonary effort is normal.  Musculoskeletal:        General: Normal range of motion.  Skin:    General: Skin is warm.  Neurological:     Mental Status: She is alert.     Neurovascular was intact with minimal indication of diabetic neuropathy appears to be mostly just generalized diabetes.  Is noted to have breaking metatarsal head deformity right over left with a short fourth digit right over left with patient having questions concerning that and problems around the first metatarsal     Assessment:  HAV deformity breaking metatarsal head diabetes that is running a very high A1c     Plan:  H&P discussed at this point I did explain breaking metatarsal and the possibility for elongation but did not recommend currently but she is starting to develop some fissure plantar right that needs to be watched.  I discussed at great length the importance of getting her A1c under better control and she promises to do that

## 2024-09-09 ENCOUNTER — Other Ambulatory Visit: Payer: Self-pay

## 2024-09-12 ENCOUNTER — Encounter: Payer: Self-pay | Admitting: Podiatry

## 2024-09-12 NOTE — Telephone Encounter (Signed)
 Fine to send her a note

## 2024-09-14 ENCOUNTER — Other Ambulatory Visit: Payer: Self-pay

## 2024-09-14 MED ORDER — OZEMPIC (0.25 OR 0.5 MG/DOSE) 2 MG/3ML ~~LOC~~ SOPN
PEN_INJECTOR | SUBCUTANEOUS | 1 refills | Status: AC
  Filled 2024-09-14 (×2): qty 3, 28d supply, fill #0
  Filled 2024-11-02 – 2024-11-07 (×2): qty 3, 28d supply, fill #1

## 2024-09-16 ENCOUNTER — Other Ambulatory Visit: Payer: Self-pay

## 2024-09-19 NOTE — Telephone Encounter (Signed)
 cld and lft mess to confirm email and date she needs note for 09/08/24. adv ok to leave on vmail.

## 2024-09-20 ENCOUNTER — Other Ambulatory Visit: Payer: Self-pay

## 2024-09-21 NOTE — Progress Notes (Signed)
 error

## 2024-09-26 ENCOUNTER — Encounter: Payer: Self-pay | Admitting: Family

## 2024-09-26 ENCOUNTER — Ambulatory Visit: Payer: Self-pay | Admitting: Family

## 2024-09-26 ENCOUNTER — Ambulatory Visit (INDEPENDENT_AMBULATORY_CARE_PROVIDER_SITE_OTHER): Admitting: Family

## 2024-09-26 VITALS — BP 137/87 | HR 94 | Temp 98.9°F | Resp 16 | Ht 65.0 in | Wt 221.8 lb

## 2024-09-26 DIAGNOSIS — Z794 Long term (current) use of insulin: Secondary | ICD-10-CM | POA: Diagnosis not present

## 2024-09-26 DIAGNOSIS — Z01419 Encounter for gynecological examination (general) (routine) without abnormal findings: Secondary | ICD-10-CM | POA: Diagnosis not present

## 2024-09-26 DIAGNOSIS — E1165 Type 2 diabetes mellitus with hyperglycemia: Secondary | ICD-10-CM | POA: Diagnosis not present

## 2024-09-26 LAB — POCT GLYCOSYLATED HEMOGLOBIN (HGB A1C): Hemoglobin A1C: 10.5 % — AB (ref 4.0–5.6)

## 2024-09-26 NOTE — Progress Notes (Signed)
 4 week follow up

## 2024-09-26 NOTE — Progress Notes (Signed)
 Patient ID: Stacey Byrd, female    DOB: 10-22-1980  MRN: [id number]  CC: Chronic Conditions Follow-Up  Subjective: Stacey Byrd is a 43 y.o. female who presents for chronic conditions follow-up.   Her concerns today include:  - Patient established with [facility] (last office visit 09/08/2024) for diabetes and related chronic conditions management. Today patient reports doing well on medication regimen, no issues/concerns. Home blood sugars mostly 140's. Since previous office visit she established with [facility]. Denies red flag symptoms associated with diabetes. Requests hemoglobin A1c lab today.  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 100 each 12   atorvastatin  (LIPITOR) 80 MG tablet Take 1 tablet (80 mg total) by mouth daily. 90 tablet 1   Blood Glucose Monitoring Suppl (ACCU-CHEK GUIDE) w/Device KIT Testing 4 (four) times daily -  before meals and at bedtime. Use as directed 1 kit 0   Continuous Glucose Sensor (FREESTYLE LIBRE 3 PLUS SENSOR) MISC Change sensor every 15 (fifteen) days. 6 each 4   glucose blood (ACCU-CHEK GUIDE TEST) test strip Testing 4 (four) times daily -  before meals and at bedtime. Use as instructed 100 each 12   hydrOXYzine  (VISTARIL ) 25 MG capsule Take 1 capsule (25 mg total) by mouth every 8 (eight) hours as needed. 30 capsule 0   insulin  glargine (LANTUS ) 100 UNIT/ML Solostar Pen Inject 10 Units into the skin daily. 15 mL 0   Insulin  Glargine Solostar  (LANTUS ) 100 UNIT/ML Solostar Pen Inject 10 Units into the skin daily. 15 mL 2   Insulin  Pen Needle (PEN NEEDLES) 31G X 8 MM MISC Use as directed. 100 each 12   metFORMIN  (GLUCOPHAGE -XR) 500 MG 24 hr tablet Take 1 tablet (500 mg total) by mouth 2 (two) times a day. 180 tablet 2   pioglitazone  (ACTOS ) 15 MG tablet Take 1 tablet (15 mg total) by mouth daily. 30 tablet 6   Semaglutide ,0.25 or 0.5MG /DOS, (OZEMPIC , 0.25 OR 0.5 MG/DOSE,) 2 MG/3ML SOPN Inject 0.375 ml (0.25 mg total) under the skin every 7 days for 4 weeks, then increase to 0.75 ml (0.5 mg total) under the skin every 7 days thereafter. 3 mL 1   triamcinolone  cream (KENALOG ) 0.1 % Apply 1 Application topically 2 (two) times daily. 30 g 0   valACYclovir  (VALTREX ) 1000 MG tablet Take 1 tablet (1,000 mg total) by mouth daily. Take for 5 days 5 tablet 5   Vitamin D , Ergocalciferol , (DRISDOL ) 1.25 MG (50000 UNIT) CAPS capsule Take 1 capsule (50,000 Units total) by mouth every 7 (seven) days for 12 doses. 12 capsule 0   [DISCONTINUED] cetirizine  (ZYRTEC  ALLERGY) 10 MG tablet Take 1 tablet (10 mg total) by mouth daily. 30 tablet 0   No current facility-administered medications on file prior to visit.    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[employer] [place]    Employer: [employer]  Tobacco Use   Smoking status: Never   Smokeless tobacco: Never   Tobacco comments:    quit in early 20's  Vaping Use   Vaping status: Never Used  Substance and Sexual Activity   Alcohol use: Yes    Comment: occasional-monthly   Drug use: No   Sexual activity: Not Currently    Birth control/protection: None  Other Topics Concern   Not on file  Social History Narrative    Single, lives alone, no pets   Social Drivers of Health   Financial Resource Strain: High Risk (08/15/2024)   Overall Financial Resource Strain (CARDIA)    Difficulty of Paying Living Expenses: Hard  Food Insecurity: Low Risk (09/08/2024)   Received from [facility]   Hunger Vital Sign    Within the past 12 months, you worried that your food would run out before you got money to buy more: Never true    Within the past 12 months, the food you bought just didn't last and you didn't have money to get more. : Never true  Transportation Needs: No Transportation Needs (09/08/2024)   Received from [facility]    In the past 12 months, has lack of reliable transportation kept you from medical appointments, meetings, work or from getting things needed for daily living? : No  Physical Activity: Inactive (08/14/2024)   Exercise Vital Sign    Days of Exercise per Week: 0 days    Minutes of Exercise per Session: Not on file  Stress: No Stress Concern Present (08/14/2024)   [employer] of Occupational Health - Occupational Stress Questionnaire    Feeling of Stress: Only a little  Social Connections: Moderately Isolated (08/14/2024)   Social Connection and Isolation Panel    Frequency of Communication with Friends and Family: More than three times a week    Frequency of Social Gatherings with Friends and Family: Twice a week    Attends Religious Services: More than 4 times per year    Active Member of [employer] or Organizations: No    Attends Engineer, Structural: Not on file    Marital Status: Never married  Intimate Partner Violence: Not At Risk (08/15/2024)   Humiliation, Afraid, Rape, and Kick questionnaire    Fear of Current or Ex-Partner: No    Emotionally Abused: No    Physically Abused: No    Sexually Abused: No    Family History  Problem Relation Age of Onset   Diabetes Mother    Hypertension Mother    Gout Mother    Crohn's disease Mother     Osteoporosis Mother    Kidney disease Mother        mild, related to DM   Hyperlipidemia Mother    Stroke Father 11   Hypertension Father    Seizures Father        related to stroke   Hyperlipidemia Father    Kidney Stones Brother    Hyperlipidemia Maternal Grandmother    Cancer Neg Hx    Heart disease Neg Hx     Past Surgical History:  Procedure Laterality Date   BREAST REDUCTION SURGERY  07/2010   REDUCTION MAMMAPLASTY Bilateral 2010    ROS: Review of Systems Negative except as stated above  PHYSICAL EXAM: BP 137/87  Pulse 94   Temp 98.9 F (37.2 C) (Oral)   Resp 16   Ht 5' 5 (1.651 m)   Wt 221 lb 12.8 oz (100.6 kg)   LMP 09/05/2024 (Approximate)   SpO2 98%   BMI 36.91 kg/m   Physical Exam HENT:     Head: Normocephalic and atraumatic.     Nose: Nose normal.     Mouth/Throat:     Mouth: Mucous membranes are moist.     Pharynx: Oropharynx is clear.  Eyes:     Extraocular Movements: Extraocular movements intact.     Conjunctiva/sclera: Conjunctivae normal.     Pupils: Pupils are equal, round, and reactive to light.  Cardiovascular:     Rate and Rhythm: Normal rate and regular rhythm.     Pulses: Normal pulses.     Heart sounds: Normal heart sounds.  Pulmonary:     Effort: Pulmonary effort is normal.     Breath sounds: Normal breath sounds.  Musculoskeletal:        General: Normal range of motion.     Cervical back: Normal range of motion and neck supple.  Neurological:     General: No focal deficit present.     Mental Status: She is alert and oriented to person, place, and time.  Psychiatric:        Mood and Affect: Mood normal.        Behavior: Behavior normal.     ASSESSMENT AND PLAN: 1. Type 2 diabetes mellitus with hyperglycemia, with long-term current use of insulin  (HCC) (Primary) - Hemoglobin A1c result pending.  - Continue present management as managed per established Endocrinology.  - Discussed the importance of healthy eating habits,  low-carbohydrate diet, low-sugar diet, regular aerobic exercise (at least 150 minutes a week as tolerated) and medication compliance to achieve or maintain control of diabetes. Counseled on medication adherence/adverse effects.  - Keep all scheduled appointments with established Endocrinology. - Follow-up with primary provider as scheduled.  - POCT glycosylated hemoglobin (Hb A1C); Future  Patient was given the opportunity to ask questions.  Patient verbalized understanding of the plan and was able to repeat key elements of the plan. Patient was given clear instructions to go to Emergency Department or return to medical center if symptoms don't improve, worsen, or new problems develop.The patient verbalized understanding.   Orders Placed This Encounter  Procedures   POCT glycosylated hemoglobin (Hb A1C)     Requested Prescriptions    No prescriptions requested or ordered in this encounter    Return for Follow-up as needed.  Greig JINNY Chute, NP

## 2024-09-29 ENCOUNTER — Ambulatory Visit: Admitting: Clinical

## 2024-09-29 DIAGNOSIS — F4321 Adjustment disorder with depressed mood: Secondary | ICD-10-CM

## 2024-09-29 NOTE — Progress Notes (Unsigned)
 [facility] Counselor Progress Note - TELEMEDICINE VISIT  Patient ID: KINZA GOUVEIA, MRN: [id number]    Date: 09/29/2024  Time Spent: 12pm   - 1pm  : 60 Minutes  Types of Service: Individual psychotherapy and Video visit  Client and/or Legal Guardian location: [employer]- [employer], [place] Therapist location: [facility] [employer] [facility] [place], [place] All persons participating in visit: Client & this therapy  I connected with client and/or legal guardian via Engineer, Civil (consulting)  (Video is Surveyor, mining) and verified that I am speaking with the correct person using two identifiers. Discussed confidentiality: Yes   I discussed the limitations of telemedicine and the availability of in person appointments.  Discussed there is a possibility of technology failure and discussed alternative modes of communication if that failure occurs.  I discussed that engaging in this telemedicine visit, they consent to the provision of behavioral healthcare and the services will be billed under their insurance.  Client and/or legal guardian expressed understanding and consented to Telemedicine visit: Yes    Presenting Concerns:  - concerns with self-doubt which can be barriers to expanding her career and may also affect her relationships  Mental Status Exam: Appearance:  Neat     Behavior: Appropriate  Motor: Normal  Speech/Language:  Normal Rate  Affect: Appropriate  Mood: normal  Thought process: normal  Thought content:   WNL  Sensory/Perceptual disturbances:   WNL  Orientation: oriented to person, place, time/date, situation, and day of week  Attention: Good  Concentration: Good  Memory: WNL  Fund of knowledge:  Good  Insight:   Good  Judgment:  Good  Impulse Control: Good   Risk Assessment: Danger to Self:  No Self-injurious Behavior: No Danger to Others: No Duty to Warn:no   Subjective:  Shanora reported ongoing concerns with her self-confidence    Interventions: Cognitive Behavioral Therapy and Motivational Interviewing  Client Response: Achsah is finding the time to process current thoughts, feelings & behaviors. And then work on changing her thoughts & actions to improve her mood and self-confidence.   Diagnosis:  Adjustment disorder with depressed mood   Goals, Assessment & Plan:   Evelene continues to increase insights about herself.  She is motivated to change cognitive and behavioral patterns that are barriers to improving her health and self-confidence.  Treatment Level 2 times a month.   Modality - TeleMedicine - Video   Goal: Increase knowledge of coping skills - Increase knowledge on unhealthy thinking habits (CBT)   Objective: Increase ability to stop and think about the action she is doing to enhance self-control & motivation for self-care activities.  - Listening to someone talking about unwavering self-belief motivates her build her own self-confidence.  Lyndell Gillyard was going to the gym but due to schedule had stopped.  She will plan on working out again starting on Monday.  - She scheduled appt with career services.  - She will continue participating in Bible Study every Sunday.   Target Date: 12/18/2024  Progress: Ongoing     Kyran Whittier P. Trudy, MSW, LCSW [employer] Therapist Main Office: [phone]

## 2024-10-01 ENCOUNTER — Ambulatory Visit
Admission: EM | Admit: 2024-10-01 | Discharge: 2024-10-01 | Disposition: A | Discharge status: Home / Self Care | Attending: Family Medicine | Admitting: Family Medicine

## 2024-10-01 ENCOUNTER — Ambulatory Visit

## 2024-10-01 ENCOUNTER — Encounter: Payer: Self-pay | Admitting: Emergency Medicine

## 2024-10-01 DIAGNOSIS — S61204A Unspecified open wound of right ring finger without damage to nail, initial encounter: Secondary | ICD-10-CM

## 2024-10-01 DIAGNOSIS — S60041A Contusion of right ring finger without damage to nail, initial encounter: Secondary | ICD-10-CM | POA: Diagnosis not present

## 2024-10-01 DIAGNOSIS — S67194A Crushing injury of right ring finger, initial encounter: Secondary | ICD-10-CM | POA: Diagnosis not present

## 2024-10-01 NOTE — Discharge Instructions (Signed)
 Return in 10 days for removal of sutures.  You have been diagnosed with an abrasion/laceration that requires wound care.  -Use you have the area clean and dry -Change your bandage at least daily, keep bandage clean and dry.  If bandages become soiled or wet they need to be changed soon as possible. -Will get wound daily, if you notice color discharge, foul odor, or pale/gray/black discoloration of skin, increased redness, pain, or swelling you need to seek attention immediately. -You may use ibuprofen  and Tylenol  as needed for pain control.

## 2024-10-01 NOTE — ED Triage Notes (Signed)
 Pt presents c/o R hand injury x 1 day. Pt states,  I slammed my finger in the car door about 20 minutes ago.

## 2024-10-01 NOTE — ED Provider Notes (Addendum)
 [name]    CSN: [id number] Arrival date & time: 10/01/24  1333      History   Chief Complaint Chief Complaint  Patient presents with   Finger Injury    R Hand - 4th finger     HPI Stacey Byrd is a 43 y.o. female.   Pt presents today due to slamming 4th finger of right hand in car door 20 mins prior to arrival. Pt states that she came straight to urgent care. Pt has a 1 cm laceration of DIP joint of right ring finger.   The history is provided by the patient.    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 Authorizing Provider  Accu-Chek Softclix Lancets lancets Testing 4 (four) times daily -  before meals and at bedtime. 08/15/24   Jaycee Greig PARAS, NP  atorvastatin  (LIPITOR) 80 MG tablet Take 1 tablet (80 mg total) by mouth daily. 03/07/24     Blood Glucose Monitoring Suppl (ACCU-CHEK GUIDE) w/Device KIT Testing 4 (four) times daily -  before meals and at bedtime. Use  as directed 08/15/24   Jaycee Greig PARAS, NP  Continuous Glucose Sensor (FREESTYLE LIBRE 3 PLUS SENSOR) MISC Change sensor every 15 (fifteen) days. 09/08/24     glucose blood (ACCU-CHEK GUIDE TEST) test strip Testing 4 (four) times daily -  before meals and at bedtime. Use as instructed 08/15/24   Jaycee Greig PARAS, NP  hydrOXYzine  (VISTARIL ) 25 MG capsule Take 1 capsule (25 mg total) by mouth every 8 (eight) hours as needed. 04/26/24   Gladis Elsie BROCKS, PA-C  insulin  glargine (LANTUS ) 100 UNIT/ML Solostar Pen Inject 10 Units into the skin daily. 08/16/24   Jaycee Greig PARAS, NP  Insulin  Glargine Solostar (LANTUS ) 100 UNIT/ML Solostar Pen Inject 10 Units into the skin daily. 09/08/24     Insulin  Pen Needle (PEN NEEDLES) 31G X 8 MM MISC Use as directed. 08/16/24   Jaycee Greig PARAS, NP  metFORMIN  (GLUCOPHAGE -XR) 500 MG 24 hr tablet Take 1 tablet (500 mg total) by mouth 2 (two) times a day. 09/08/24     pioglitazone  (ACTOS ) 15 MG  tablet Take 1 tablet (15 mg total) by mouth daily. 03/07/24     Semaglutide ,0.25 or 0.5MG /DOS, (OZEMPIC , 0.25 OR 0.5 MG/DOSE,) 2 MG/3ML SOPN Inject 0.375 ml (0.25 mg total) under the skin every 7 days for 4 weeks, then increase to 0.75 ml (0.5 mg total) under the skin every 7 days thereafter. 09/14/24     triamcinolone  cream (KENALOG ) 0.1 % Apply 1 Application topically 2 (two) times daily. 04/26/24   Gladis Elsie BROCKS, PA-C  valACYclovir  (VALTREX ) 1000 MG tablet Take 1 tablet (1,000 mg total) by mouth daily. Take for 5 days 04/14/24   Eveline Lynwood MATSU, MD  Vitamin D , Ergocalciferol , (DRISDOL ) 1.25 MG (50000 UNIT) CAPS capsule Take 1 capsule (50,000 Units total) by mouth every 7 (seven) days for 12 doses. 08/16/24 11/02/24  Jaycee Greig PARAS, NP  cetirizine  (ZYRTEC  ALLERGY) 10 MG tablet Take 1 tablet (10 mg total) by mouth daily. 09/05/20 12/03/20  Hall-Potvin, Brittany, PA-C    Family History Family History  Problem Relation Age of Onset   Diabetes Mother    Hypertension Mother    Gout Mother     Crohn's disease Mother    Osteoporosis Mother    Kidney disease Mother        mild, related to DM   Hyperlipidemia Mother    Stroke Father 10   Hypertension Father    Seizures Father        related to stroke   Hyperlipidemia Father    Kidney Stones Brother    Hyperlipidemia Maternal Grandmother    Cancer Neg Hx    Heart disease Neg Hx     Social History Social History[1]   Allergies   Patient has no known allergies.   Review of Systems Review of Systems   Physical Exam Triage Vital Signs ED Triage Vitals  Encounter Vitals Group     BP 10/01/24 1356 (!) 142/96     Girls Systolic BP Percentile --      Girls Diastolic BP Percentile --      Boys Systolic BP Percentile --      Boys Diastolic BP Percentile --      Pulse Rate 10/01/24 1356 (!) 107     Resp 10/01/24 1356 18     Temp 10/01/24 1356 98.3 F (36.8 C)     Temp Source 10/01/24 1356 Oral     SpO2 10/01/24 1356 98 %     Weight 10/01/24 1356 221 lb 12.5 oz (100.6 kg)     Height --      Head Circumference --      Peak Flow --      Pain Score 10/01/24 1355 10     Pain Loc --      Pain Education --      Exclude from Growth Chart --    No data found.  Updated Vital Signs BP (!) 142/96 (BP Location: Left Arm)   Pulse (!) 107   Temp 98.3 F (36.8 C) (Oral)   Resp 18   Wt 221 lb 12.5 oz (100.6 kg)   LMP 09/05/2024 (Approximate)   SpO2 98%   BMI 36.91 kg/m   Visual Acuity Right Eye Distance:   Left Eye Distance:   Bilateral Distance:    Right Eye Near:   Left Eye Near:    Bilateral Near:     Physical Exam Vitals and nursing note reviewed.  Constitutional:      General: She is not in acute distress.    Appearance:  Normal appearance. She is not ill-appearing, toxic-appearing or diaphoretic.  Eyes:     General: No scleral icterus. Cardiovascular:     Rate and Rhythm: Normal rate and regular rhythm.     Heart sounds: Normal heart sounds.  Pulmonary:     Effort: Pulmonary effort is normal. No  respiratory distress.     Breath sounds: Normal breath sounds. No wheezing or rhonchi.  Musculoskeletal:       Hands:     Comments: 1 cm laceration of right ring finger  Skin:    General: Skin is warm.  Neurological:     Mental Status: She is alert and oriented to person, place, and time.  Psychiatric:        Mood and Affect: Mood normal.        Behavior: Behavior normal.      UC Treatments / Results  Labs (all labs ordered are listed, but only abnormal results are displayed) Labs Reviewed - No data to display  EKG   Radiology DG Finger Ring Right Result Date: 10/01/2024 CLINICAL DATA:  Slammed finger in car door. EXAM: RIGHT RING FINGER 2+V COMPARISON:  None Available. FINDINGS: No acute fracture or dislocation is seen. Soft tissue swelling is noted at the distal fourth digit. IMPRESSION: No acute fracture or dislocation. Electronically Signed   By: Leita Birmingham M.D.   On: 10/01/2024 14:38    Procedures Laceration Repair: R ring finger  Date/Time: 10/01/2024 3:10 PM  Performed by: Andra Corean BROCKS, PA-C Authorized by: Vonna Sharlet POUR, MD   Consent:    Consent obtained:  Verbal   Consent given by:  Patient   Risks discussed:  Pain   Alternatives discussed:  No treatment Universal protocol:    Procedure explained and questions answered to patient or proxy's satisfaction: yes     Patient identity confirmed:  Verbally with patient Anesthesia:    Anesthesia method:  Nerve block   Block location:  DIP of right ring finger   Block needle gauge:  25 G   Block anesthetic:  Lidocaine 1% w/o epi   Block injection procedure:  Anatomic landmarks identified, anatomic landmarks palpated, introduced needle and incremental injection   Block outcome:  Anesthesia achieved Laceration details:    Location:  Finger   Finger location:  R ring finger   Length (cm):  1 Pre-procedure details:    Preparation:  Patient was prepped and draped in usual sterile  fashion Exploration:    Imaging outcome: foreign body not noted     Contaminated: no   Treatment:    Area cleansed with:  Chlorhexidine   Amount of cleaning:  Standard Skin repair:    Repair method:  Sutures   Suture size:  5-0   Suture technique:  Simple interrupted   Number of sutures:  2 Approximation:    Approximation:  Close Repair type:    Repair type:  Simple Post-procedure details:    Procedure completion:  Tolerated well, no immediate complications  (including critical care time)  Medications Ordered in UC Medications - No data to display  Initial Impression / Assessment and Plan / UC Course  I have reviewed the triage vital signs and the nursing notes.  Pertinent labs & imaging results that were available during my care of the patient were reviewed by me and considered in my medical decision making (see chart for details).     Final Clinical Impressions(s) / UC Diagnoses   Final diagnoses:  Contusion of right ring finger  without damage to nail, initial encounter  Open wound of right ring finger     Discharge Instructions      Return in 10 days for removal of sutures.  You have been diagnosed with an abrasion/laceration that requires wound care.  -Use you have the area clean and dry -Change your bandage at least daily, keep bandage clean and dry.  If bandages become soiled or wet they need to be changed soon as possible. -Will get wound daily, if you notice color discharge, foul odor, or pale/gray/black discoloration of skin, increased redness, pain, or swelling you need to seek attention immediately. -You may use ibuprofen  and Tylenol  as needed for pain control.     ED Prescriptions   None    PDMP not reviewed this encounter.    Andra Corean BROCKS, PA-C 10/01/24 1509     [1]  Social History Tobacco Use   Smoking status: Never   Smokeless tobacco: Never   Tobacco comments:    quit in early 20's  Vaping Use   Vaping status: Never Used   Substance Use Topics   Alcohol use: Yes    Comment: occasional-monthly   Drug use: No     Andra Corean BROCKS, PA-C 10/01/24 1511

## 2024-10-03 ENCOUNTER — Encounter: Admitting: Skilled Nursing Facility1

## 2024-10-03 ENCOUNTER — Encounter: Payer: Self-pay | Admitting: Skilled Nursing Facility1

## 2024-10-03 ENCOUNTER — Telehealth (HOSPITAL_COMMUNITY): Payer: Self-pay

## 2024-10-03 DIAGNOSIS — E119 Type 2 diabetes mellitus without complications: Secondary | ICD-10-CM | POA: Insufficient documentation

## 2024-10-03 NOTE — Progress Notes (Unsigned)
 A1C 12.4  DM Medications: Lantus  10 units one time a day typically around 6-7pm  Metformin   Added Ozempic   CGM Libre  Pt states she does check her blood sugars: checking 1-2 times a day: 180, 202; sometimes mid day before a meal 250, 156, 230, 181, 198 Wakes around 6am eating around 10-10:30 am.  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  Pt states she enjoys healthy food.  Pt states she has been drinking more water and cutting back on soda and kool aid.  Pt states there is a gym at her job.  Pt states she sometimes will forget to take her insulin .   TIR 92% for 14 days; 6.7% GMI  Goals: Brush your teeth 2 times a day 7 days a week Floss Daily Check your feet daily looking for anything that was not there the day before  Be sure to take your insulin  at around the same time every evening  Go to the gym after work 3 days a week; walking    24 hr recall: Breakfast: Oatmeal and sausage Lunch: vegetable soup and PBJ on wheat Dinner: spaghetti and salad  Beverages: water

## 2024-10-03 NOTE — Telephone Encounter (Signed)
 Pt left voicemail asking whether or not she received a tetanus shot at her 10/01/24 visit. No documented administration or order. Do not see mention of it in note. Called pt, who states she is unaware of last tetanus shot.   Please advise if pt needs to return for injection. Thanks.

## 2024-10-04 NOTE — Telephone Encounter (Signed)
 Attempted to reach patient x1. LVM with pertinent information. Pt's outgoing message had stated her name.  MyChart message sent as well.

## 2024-10-10 ENCOUNTER — Ambulatory Visit (INDEPENDENT_AMBULATORY_CARE_PROVIDER_SITE_OTHER): Admitting: Clinical

## 2024-10-10 DIAGNOSIS — F4321 Adjustment disorder with depressed mood: Secondary | ICD-10-CM | POA: Diagnosis not present

## 2024-10-10 NOTE — Progress Notes (Unsigned)
 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 Discussed confidentiality: Yes   I discussed the limitations of telemedicine and the availability of in person appointments.  Discussed there is a possibility of technology failure and discussed alternative modes of communication if that failure occurs.  I discussed that engaging in this telemedicine visit, they consent to the provision of behavioral healthcare and the services will be billed under their insurance.  Client and/or legal guardian expressed understanding and consented to Telemedicine visit: Yes    Presenting Concerns: ***  Mental Status Exam: Appearance:  {PSY:22683}     Behavior: {PSY:21022743}  Motor: {PSY:22302}  Speech/Language:  {PSY:22685}  Affect: {PSY:22687}  Mood: {PSY:31886}  Thought process: {PSY:31888}  Thought content:   {[phone]}  Sensory/Perceptual disturbances:   {[phone]}  Orientation: {PSY:30297}  Attention: {PSY:22877}  Concentration: {PSY:336-859-9591}  Memory: {PSY:743-260-6442}  Fund of knowledge:  {PSY:336-859-9591}  Insight:   {PSY:336-859-9591}  Judgment:  {PSY:336-859-9591}  Impulse Control: {PSY:336-859-9591}   Risk Assessment: Danger to Self:  {PSY:22692} Self-injurious Behavior: {PSY:22692} Danger to Others: {PSY:22692} Duty to Warn:{PSY:311194}   Subjective:  ***   Interventions: {PSY:719-334-2397}  Client  Response: ***   Diagnosis:  No diagnosis found.   Goals, Assessment & Plan:   ***  Treatment Level 2 times a month.   Modality - TeleMedicine - Video   Goal: Increase knowledge of coping skills - Increase knowledge on unhealthy thinking habits (CBT)   Objective: Increase ability to stop and think about the action she is doing to enhance self-control & motivation for self-care activities. ***   - Listening to someone talking about unwavering self-belief motivates her build her own self-confidence.   Stacey Byrd was going to the gym but due to schedule had stopped.  She will plan on working out again starting on Monday.   - She scheduled appt with career services.   - She will continue participating in Bible Study every Sunday.     Target Date: 12/18/2024  Progress: Ongoing     Stacey Byrd, MSW, LCSW [employer] Therapist Main Office: [phone]

## 2024-10-11 ENCOUNTER — Ambulatory Visit
Admission: RE | Admit: 2024-10-11 | Discharge: 2024-10-11 | Disposition: A | Discharge status: Home / Self Care | Source: Ambulatory Visit

## 2024-10-11 ENCOUNTER — Other Ambulatory Visit: Payer: Self-pay

## 2024-10-11 DIAGNOSIS — S61204A Unspecified open wound of right ring finger without damage to nail, initial encounter: Secondary | ICD-10-CM

## 2024-10-11 MED ORDER — TETANUS-DIPHTH-ACELL PERTUSSIS 5-2-15.5 LF-MCG/0.5 IM SUSP
0.5000 mL | Freq: Once | INTRAMUSCULAR | Status: AC
  Administered 2024-10-11: 0.5 mL via INTRAMUSCULAR

## 2024-10-11 NOTE — ED Provider Notes (Signed)
 Pt presents for suture removal and tetanus immunization.   Andra Corean BROCKS, PA-C 10/11/24 1835

## 2024-10-11 NOTE — ED Triage Notes (Signed)
 Patient is here today for suture removal and to receive a tetanus shot .

## 2024-10-24 ENCOUNTER — Ambulatory Visit: Admitting: Orthopedic Surgery

## 2024-10-24 ENCOUNTER — Other Ambulatory Visit (INDEPENDENT_AMBULATORY_CARE_PROVIDER_SITE_OTHER)

## 2024-10-24 ENCOUNTER — Encounter: Payer: Self-pay | Admitting: Orthopedic Surgery

## 2024-10-24 ENCOUNTER — Other Ambulatory Visit: Payer: Self-pay

## 2024-10-24 VITALS — BP 130/92 | HR 102 | Ht 65.0 in | Wt 221.0 lb

## 2024-10-24 DIAGNOSIS — M25532 Pain in left wrist: Secondary | ICD-10-CM | POA: Diagnosis not present

## 2024-10-24 DIAGNOSIS — M654 Radial styloid tenosynovitis [de Quervain]: Secondary | ICD-10-CM

## 2024-10-24 MED ORDER — MELOXICAM 7.5 MG PO TABS
7.5000 mg | ORAL_TABLET | Freq: Every day | ORAL | 5 refills | Status: AC
  Filled 2024-10-24: qty 30, 30d supply, fill #0

## 2024-10-24 NOTE — Progress Notes (Signed)
" °  Intake history:  Chief Complaint  Patient presents with   Wrist Pain    Left      BP (!) 130/92   Pulse (!) 102   Ht 5' 5 (1.651 m)   Wt 221 lb (100.2 kg)   LMP 09/05/2024 (Approximate)   BMI 36.78 kg/m  Body mass index is 36.78 kg/m.  Pharmacy? _______[facility]______________________________  WHAT ARE WE SEEING YOU FOR TODAY?   Left wrist  How long has this bothered you? (DOI?DOS?WS?)    Was there an injury? No  Anticoag.  No   Any ALLERGIES _______Allergies[1] _______________________________________   Treatment:  Have you taken:  Tylenol  No  Advil  No  Had PT No  Had injection No  Other  _________________________        [1] No Known Allergies  "

## 2024-10-24 NOTE — Progress Notes (Signed)
 "  Office Visit Note   Patient: Stacey Byrd           Date of Birth: 10/11/1981           MRN: [id number] Visit Date: 10/24/2024 Requested by: Jaycee Greig PARAS, NP [id number] [place],  [place] [address] PCP: Jaycee Greig PARAS, NP   Assessment & Plan:   Encounter Diagnosis  Name Primary?   De Quervain's tenosynovitis, left Yes   Start with nonoperative treatment with anti-inflammatory splinting and ice.  Avoid steroids initially unless we do not get good results from current treatment.  Recheck in 30 days Meds ordered this encounter  Medications   meloxicam  (MOBIC ) 7.5 MG tablet    Sig: Take 1 tablet (7.5 mg total) by mouth daily.    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  She did take Tylenol  and Advil  but did not get good relief              ROS: Negative for neurologic symptoms   Images personally read and my interpretation :  DG Wrist Complete Left Result Date: 10/24/2024 Report of imaging left wrist pain probable tendinitis pain in the first extensor compartment and some tenderness around the wrist The wrist looks normal in terms of bony alignment there is no evidence of joint space narrowing Carpal bones look normal Impression normal wrist     Visit Diagnoses:  1. De Quervain's tenosynovitis, left      Follow-Up Instructions: Return in about 1 month (around 11/24/2024) for FOLLOW UP, LEFT-[facility] .    Objective: Vital Signs: BP (!) 130/92   Pulse (!) 102   Ht 5' 5 (1.651 m)   Wt 221 lb (100.2 kg)   LMP 09/05/2024 (Approximate)   BMI 36.78 kg/m   Physical Exam Vitals and nursing note reviewed.  Constitutional:       Appearance: Normal appearance.  HENT:     Head: Normocephalic and atraumatic.  Eyes:     General: No scleral icterus.       Right eye: No discharge.        Left eye: No discharge.     Extraocular Movements: Extraocular movements intact.     Conjunctiva/sclera: Conjunctivae normal.     Pupils: Pupils are equal, round, and reactive to light.  Cardiovascular:     Rate and Rhythm: Normal rate.     Pulses: Normal pulses.  Skin:    General: Skin is warm and dry.     Capillary Refill: Capillary refill takes less than 2 seconds.  Neurological:     General: No focal deficit present.     Mental Status: She is alert and oriented to person, place, and time.  Psychiatric:        Mood and Affect: Mood normal.        Behavior: Behavior normal.        Thought Content: Thought content normal.        Judgment: Judgment normal.      Left Hand Exam   Comments:  Left wrist there is tenderness over the  first extensor compartment and dorsal wrist joint.  Painful range of motion prevents full excursion.  Positive Finkelstein's test for de Quervain's syndrome.  Flexor tendon and extensor tendon function is normal         Specialty Comments:  No specialty comments available.  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   Mixed hyperlipidemia 07/08/2019   Type 2 diabetes mellitus (HCC) 07/08/2019   History of endometrial polyp 09/29/2016   Vitamin D  deficiency 02/17/2013   Pure hypercholesterolemia 02/17/2013   Past Medical History:  Diagnosis Date   Abnormal uterine bleeding  04/03/2016   DM (diabetes mellitus), type 2 (HCC)    Eczema    sees dr ivin   Head ache    High cholesterol    Infection    UTI   Kidney stone 2013   [facility]   Normal pressure hydrocephalus (HCC) 08/2011   [facility]   Obesity, unspecified     Family History  Problem Relation Age of Onset   Diabetes Mother    Hypertension Mother    Gout Mother    Crohn's disease Mother    Osteoporosis Mother    Kidney disease Mother        mild, related to DM   Hyperlipidemia Mother    Stroke Father 59   Hypertension Father    Seizures Father        related to stroke   Hyperlipidemia Father    Kidney Stones Brother    Hyperlipidemia Maternal Grandmother    Cancer Neg Hx    Heart disease Neg Hx     Past Surgical History:  Procedure Laterality Date   BREAST REDUCTION SURGERY  07/2010   REDUCTION MAMMAPLASTY Bilateral 2010   Social History   Occupational History   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

## 2024-10-24 NOTE — Patient Instructions (Addendum)
 Ice 20 min at night   Start medication   Wear splint for 30 days

## 2024-10-26 ENCOUNTER — Other Ambulatory Visit (HOSPITAL_COMMUNITY): Payer: Self-pay

## 2024-10-27 ENCOUNTER — Ambulatory Visit: Admitting: Clinical

## 2024-10-27 DIAGNOSIS — F4321 Adjustment disorder with depressed mood: Secondary | ICD-10-CM

## 2024-10-27 NOTE — Progress Notes (Unsigned)
 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 Discussed confidentiality: Yes   I discussed the limitations of telemedicine and the availability of in person appointments.  Discussed there is a possibility of technology failure and discussed alternative modes of communication if that failure occurs.  I discussed that engaging in this telemedicine visit, they consent to the provision of behavioral healthcare and the services will be billed under their insurance.  Client and/or legal guardian expressed understanding and consented to Telemedicine visit: Yes    Presenting Concerns:  ***  Mental Status Exam: No changes Appearance:  Neat     Behavior: Appropriate  Motor: Normal  Speech/Language:  Normal Rate  Affect: Appropriate  Mood: normal  Thought process: normal  Thought content:   WNL  Sensory/Perceptual disturbances:   WNL  Orientation: oriented to person, place, time/date, situation, and day of week  Attention: Good  Concentration: Good  Memory: WNL  Fund of knowledge:  Good  Insight:   Good  Judgment:  Good  Impulse Control: Good   Risk Assessment: Danger to Self:  No Self-injurious Behavior: No Danger to Others: No Duty to Warn:no   Subjective:  *** Stacey Byrd reported she has been motivated to go to the gymn.  Interventions: Motivational Interviewing and Psycho-education/Bibliotherapy-ongoing  psycho education on anxiety - Worry Tree worksheet- acknowledging what the worry about. Growth Mindset -developing abilities.  Client Response: ***   Diagnosis:  Adjustment disorder with depressed mood   Goals, Assessment & Plan:   Treatment Level 2 times a month.   Modality - TeleMedicine - Video   Goal: Increase knowledge of coping skills  Increase knowledge on unhealthy thinking habits (CBT)   Objective: Increase ability to stop and think about the action she is doing to enhance self-control & motivation for self-care activities. Stacey Byrd reported she's been able to go back to the gym      [employer] Date: 12/18/2024  Progress: Ongoing     Stacey Byrd, MSW, LCSW [employer] Therapist Main Office: [phone]                Stacey Byrd, [place]

## 2024-10-31 ENCOUNTER — Encounter: Payer: Self-pay | Admitting: Family

## 2024-11-01 NOTE — Telephone Encounter (Signed)
-   Patient preference to keep upcoming appointment. May also want to confirm with front desk chief concern for the same appointment.  - Schedule appointment to update Vitamin D  lab.

## 2024-11-02 ENCOUNTER — Other Ambulatory Visit: Payer: Self-pay

## 2024-11-03 ENCOUNTER — Other Ambulatory Visit: Payer: Self-pay | Admitting: Family

## 2024-11-03 DIAGNOSIS — E559 Vitamin D deficiency, unspecified: Secondary | ICD-10-CM

## 2024-11-04 ENCOUNTER — Encounter: Payer: Self-pay | Admitting: Family

## 2024-11-04 ENCOUNTER — Other Ambulatory Visit: Payer: Self-pay

## 2024-11-04 NOTE — Telephone Encounter (Signed)
 Schedule appointment?

## 2024-11-07 ENCOUNTER — Other Ambulatory Visit: Payer: Self-pay

## 2024-11-08 ENCOUNTER — Encounter: Payer: Self-pay | Admitting: Dermatology

## 2024-11-08 ENCOUNTER — Ambulatory Visit: Admitting: Dermatology

## 2024-11-08 ENCOUNTER — Other Ambulatory Visit: Payer: Self-pay

## 2024-11-08 VITALS — BP 143/99

## 2024-11-08 DIAGNOSIS — L209 Atopic dermatitis, unspecified: Secondary | ICD-10-CM | POA: Diagnosis not present

## 2024-11-08 DIAGNOSIS — L299 Pruritus, unspecified: Secondary | ICD-10-CM | POA: Diagnosis not present

## 2024-11-08 MED ORDER — TACROLIMUS 0.1 % EX OINT
TOPICAL_OINTMENT | Freq: Two times a day (BID) | CUTANEOUS | 2 refills | Status: AC
  Filled 2024-11-08 (×2): qty 100, 30d supply, fill #0
  Filled 2024-11-22: qty 30, 30d supply, fill #0

## 2024-11-08 MED ORDER — CLOBETASOL PROPIONATE 0.05 % EX OINT
TOPICAL_OINTMENT | CUTANEOUS | 5 refills | Status: AC
  Filled 2024-11-08: qty 60, 30d supply, fill #0

## 2024-11-08 NOTE — Patient Instructions (Addendum)
 VISIT SUMMARY:  Stacey Byrd, a 44 year old female, visited us  today due to chronic plaques on her feet, which she has had for about ten years. The plaques are located on the left dorsal foot and right lateral ankle, and they are sometimes raised and itchy. She has previously used triamcinolone , which helped with the itch but did not thin the plaques. No similar patches are present elsewhere on her body.  YOUR PLAN:  -ATOPIC DERMATITIS OF THE FEET:  Atopic dermatitis is a chronic skin condition that causes itchy and inflamed skin. In your case, it has affected your left dorsal foot and right lateral ankle for about ten years. We have prescribed clobetasol  ointment to be used for two weeks, followed by a two-week break with tacrolimus .   You should alternate between clobetasol  and tacrolimus  every two weeks. We will reassess the treatment's effectiveness in a follow-up appointment in 3-3.5 months.   If you encounter any issues with obtaining tacrolimus  due to insurance, please contact us  via MyChart. We also discussed the potential use of lightening creams after treatment to address any remaining dark spots.  INSTRUCTIONS:  Please schedule a follow-up appointment in 3-3.5 months to assess the effectiveness of the treatment. If you have any issues obtaining tacrolimus  due to insurance, contact us  via MyChart.     Important Information  Due to recent changes in healthcare laws, you may see results of your pathology and/or laboratory studies on MyChart before the doctors have had a chance to review them. We understand that in some cases there may be results that are confusing or concerning to you. Please understand that not all results are received at the same time and often the doctors may need to interpret multiple results in order to provide you with the best plan of care or course of treatment. Therefore, we ask that you please give us  2 business days to thoroughly review all your results  before contacting the office for clarification. Should we see a critical lab result, you will be contacted sooner.   If You Need Anything After Your Visit  If you have any questions or concerns for your doctor, please call our main line at [phone] If no one answers, please leave a voicemail as directed and we will return your call as soon as possible. Messages left after 4 pm will be answered the following business day.   You may also send us  a message via MyChart. We typically respond to MyChart messages within 1-2 business days.  For prescription refills, please ask your pharmacy to contact our office. Our fax number is [phone].  If you have an urgent issue when the clinic is closed that cannot wait until the next business day, you can page your doctor at the number below.    Please note that while we do our best to be available for urgent issues outside of office hours, we are not available 24/7.   If you have an urgent issue and are unable to reach us , you may choose to seek medical care at your doctor's office, retail clinic, urgent care center, or emergency room.  If you have a medical emergency, please immediately call 911 or go to the emergency department. In the event of inclement weather, please call our main line at [phone] for an update on the status of any delays or closures.  Dermatology Medication Tips: Please keep the boxes that topical medications come in in order to help keep track of the instructions about where and  how to use these. Pharmacies typically print the medication instructions only on the boxes and not directly on the medication tubes.   If your medication is too expensive, please contact our office at [phone] or send us  a message through MyChart.   We are unable to tell what your co-pay for medications will be in advance as this is different depending on your insurance coverage. However, we may be able to find a substitute medication at  lower cost or fill out paperwork to get insurance to cover a needed medication.   If a prior authorization is required to get your medication covered by your insurance company, please allow us  1-2 business days to complete this process.  Drug prices often vary depending on where the prescription is filled and some pharmacies may offer cheaper prices.  The website www.goodrx.com contains coupons for medications through different pharmacies. The prices here do not account for what the cost may be with help from insurance (it may be cheaper with your insurance), but the website can give you the price if you did not use any insurance.  - You can print the associated coupon and take it with your prescription to the pharmacy.  - You may also stop by our office during regular business hours and pick up a GoodRx coupon card.  - If you need your prescription sent electronically to a different pharmacy, notify our office through [facility] or by phone at [phone]

## 2024-11-08 NOTE — Progress Notes (Signed)
" ° °  New Patient Visit   Subjective  Stacey Byrd is a 44 y.o. female who presents for a NEW PATIENT appointment to be examined for the concerns as listed below.  Atopic Derm: Located at the L foot & outer R ankle that she started flaring 10 years ago. She is not currently using any topicals but noted that she previously used [facility] stating that it somewhat helped the itch. Pt rating her itch 7/10 without topicals. She is currently only applying Eucerin lotions/cream.    Are you nursing, pregnant or trying to conceive? No   The following portions of the chart were reviewed this encounter and updated as appropriate: medications, allergies, medical history  Review of Systems:  No other skin or systemic complaints except as noted in HPI or Assessment and Plan.  Objective  Well appearing patient in no apparent distress; mood and affect are within normal limits.   A focused examination was performed of the following areas: L foot & R ankle   Relevant exam findings are noted in the Assessment and Plan.        Assessment & Plan   ATOPIC DERMATITIS and PRURITUS  Exam: hyperpigmented lichenified plaques on L dorsal foot and lateral R ankle 5% BSA IGA 3 Itch 7/10  flared  Patient Education Discussed During Visit: Atopic dermatitis of the feet Chronic atopic dermatitis on the left dorsal foot and right lateral ankle for approximately ten years, with intermittent exacerbations. Current severity includes itching rated 6-7/10 and hyperpigmented plaques. Differential diagnosis includes psoriasis, but absence of lesions on elbows, back, scalp, or ears supports eczema. Previous treatment with triamcinolone  provided symptomatic relief but did not reduce plaque thickness. Biopsy considered but deferred due to risk of infection and slow healing, with potential for successful topical treatment.  Pt presented with Chronic atopic dermatitis on the left dorsal foot and right lateral ankle for  approximately ten years, with intermittent exacerbations. Current severity includes itching rated 6-7/10 and hyperpigmented plaques. Differential diagnosis includes psoriasis, but absence of lesions on elbows, back, scalp, or ears supports eczema. Previous treatment with triamcinolone  provided symptomatic relief but did not reduce plaque thickness. Biopsy considered but deferred due to risk of infection and slow healing, with potential for successful topical treatment. - Prescribed clobetasol  ointment for two weeks, followed by a two-week break with tacrolimus . - Instructed to alternate clobetasol  and tacrolimus  every two weeks. - Scheduled follow-up appointment in 3-3.5 months to assess treatment efficacy. - Advised to contact via MyChart if tacrolimus  is not available due to insurance issues. - Discussed potential use of lightening creams post-treatment to address residual hyperpigmentation.       No follow-ups on file.   Documentation: I have reviewed the above documentation for accuracy and completeness, and I agree with the above.  I, Shirron Maranda, CMA II, am acting as scribe for:   Delon Lenis, DO     "

## 2024-11-09 ENCOUNTER — Other Ambulatory Visit: Payer: Self-pay

## 2024-11-10 ENCOUNTER — Ambulatory Visit: Admitting: Clinical

## 2024-11-10 DIAGNOSIS — F4321 Adjustment disorder with depressed mood: Secondary | ICD-10-CM

## 2024-11-10 NOTE — Progress Notes (Signed)
 [facility] Counselor Progress Note - TELEMEDICINE VISIT  Patient ID: Stacey Byrd, MRN: [id number]    Date: 11/10/2024  Time Spent: 12:01pm - 12:56 - 55 Minutes  Types of Service: Individual psychotherapy and Video visit  Client and/or Legal Guardian location: In the car at Work- [place], [place] Therapist location: [facility] [place], [place] All persons participating in visit: Client & this therapist  I connected with client and/or legal guardian via Video Enabled Telemedicine Application  (Video is Caregility application) and verified that I am speaking with the correct person using two identifiers. Discussed confidentiality: Yes   I discussed the limitations of telemedicine and the availability of in person appointments.  Discussed there is a possibility of technology failure and discussed alternative modes of communication if that failure occurs.  I discussed that engaging in this telemedicine visit, they consent to the provision of behavioral healthcare and the services will be billed under their insurance.  Client and/or legal guardian expressed understanding and consented to Telemedicine visit: Yes    Presenting Concerns:  -Concerns with her beliefs about herself  Mental Status Exam: No changes Appearance:  Neat     Behavior: Appropriate  Motor: Normal  Speech/Language:  Normal Rate  Affect: Appropriate  Mood: normal  Thought process: normal  Thought content:   WNL  Sensory/Perceptual disturbances:   WNL  Orientation: oriented to person, place, time/date, situation, and day of week  Attention: Good  Concentration: Good  Memory: WNL  Fund of knowledge:  Good  Insight:   Good  Judgment:  Good  Impulse Control: Good   Risk Assessment: Danger to Self:  No Self-injurious Behavior: No Danger to Others: No Duty to Warn:no   Subjective:  Stacey Byrd reported stressful situations this past week.  Interventions:  Cognitive Behavioral  Therapy and Insight-Oriented  - Explored her thoughts, feelings & behaviors during those stressful situations she experienced and core beliefs negatively impacting her daily functioning and self-esteem  Client Response: Stacey Byrd was able to verbalize her thoughts, feelings, and behaviors.  She reported she called her mother which was helpful for her to hear a different perspective.  Stacey Byrd actively engaged in the visit to challenge unhelpful thoughts or beliefs, and start to replace those with more helpful ones.   Diagnosis:  Adjustment disorder with depressed mood   Goals, Assessment & Plan:   Treatment Level 2 times a month.   Modality - TeleMedicine - Video   Goal: Increase knowledge of cognitive coping skills.  Objective: Build on existing strengths by identifying at 2-3 unhelpful thinking patterns and reframing them into balanced, empowering thoughts during sessions.  Acknowledged that many of her interpretations of people's actions have developed negative core beliefs about herself.  It was difficult to think about celebrating herself and she experienced somatic responses when talking about it, including her chest tightening. She was open to thinking about how to celebrate herself and her accomplishments throughout her life.  Objective: Increase ability to stop and think about the action she is doing to enhance self-control & motivation for self-care activities.  Stacey Byrd acknowledged that reflecting on the week has helped her see things in a different perspective.  Stacey Byrd continues to go to the gym and is sleeping well, which are self-care activities that she wanted to be more consistent with. She also shared another self-care activity that she implemented yesterday.       Target Date: 12/18/2024  Progress:  Ongoing     Stacey Byrd P.  Trudy, MSW, LCSW [employer] Therapist Main Office: [phone]

## 2024-11-11 ENCOUNTER — Encounter: Payer: Self-pay | Admitting: Family

## 2024-11-14 ENCOUNTER — Ambulatory Visit: Admitting: Family

## 2024-11-21 ENCOUNTER — Ambulatory Visit: Admitting: Family

## 2024-11-21 ENCOUNTER — Ambulatory Visit: Admitting: Orthopedic Surgery

## 2024-11-22 ENCOUNTER — Other Ambulatory Visit: Payer: Self-pay

## 2024-11-22 NOTE — Telephone Encounter (Signed)
 Appt rescheduled

## 2024-11-23 ENCOUNTER — Other Ambulatory Visit: Payer: Self-pay

## 2024-11-24 ENCOUNTER — Ambulatory Visit: Admitting: Clinical

## 2024-11-24 ENCOUNTER — Ambulatory Visit: Admitting: Orthopedic Surgery

## 2024-11-24 DIAGNOSIS — F4321 Adjustment disorder with depressed mood: Secondary | ICD-10-CM

## 2024-11-24 NOTE — Progress Notes (Unsigned)
 [facility] Counselor Progress Note - TELEMEDICINE VISIT  Patient ID: Stacey Byrd, MRN: [id number]    Date: 11/24/24  Time Spent:  12:04pm 12:50pm 46 Minutes  Types of Service: Individual psychotherapy and Video visit  Client and/or Legal Guardian location: In the car at Work- [place], [place] Therapist location: [facility] - [place], [place] All persons participating in visit: Client & this therapist  I connected with client and/or legal guardian via Video Enabled Telemedicine Application  (Video is Caregility application) and verified that I am speaking with the correct person using two identifiers. Discussed confidentiality: Yes   I discussed the limitations of telemedicine and the availability of in person appointments.  Discussed there is a possibility of technology failure and discussed alternative modes of communication if that failure occurs.  I discussed that engaging in this telemedicine visit, they consent to the provision of behavioral healthcare and the services will be billed under their insurance.  Client and/or legal guardian expressed understanding and consented to Telemedicine visit: Yes    Presenting Concerns:  - Learning to change her mindset in various aspects of her life to enhance her self-esteem  Mental Status Exam: No changes Appearance:  Neat     Behavior: Appropriate  Motor: Normal  Speech/Language:  Normal Rate  Affect: Appropriate  Mood: normal  Thought process: normal  Thought content:   WNL  Sensory/Perceptual disturbances:   WNL  Orientation: oriented to person, place, time/date, situation, and day of week  Attention: Good  Concentration: Good  Memory: WNL  Fund of knowledge:  Good  Insight:   Good  Judgment:  Good  Impulse Control: Good   Risk Assessment: Danger to Self:  No Self-injurious Behavior: No Danger to Others: No Duty to Warn:no   Subjective:  Glendene reported she struggles with thinking well about  herself, decisions she's made, & her accomplishments.  Interventions:  Cognitive Behavioral Therapy and Insight-Oriented  - Identified core beliefs that continue to affect how she thinks about herself and her perspective about situations  Client Response: [name] shared her thoughts, emotions and behaviors in various situations & relationships that continue to affect how she perceives her self-worth.  She was open to challenging negative core beliefs and how that can impact a change in her thoughts, emotions and behaviors.   Diagnosis:  Adjustment disorder with depressed mood   Goals, Assessment & Plan:   Treatment Level 2 times a month.   Modality - TeleMedicine - Video   Goal: Increase knowledge of cognitive coping skills.  Objective: Build on existing strengths by identifying 2-3 unhelpful thinking patterns and reframing them into balanced, empowering thoughts during sessions. - Admire identified her faith and spiritual beliefs as a strength. Utilizing her faith in a God that she believes created her and loves her, she was able to have a more balanced perspective of herself. Instead of putting herself down, she was more open to identifying the good things about herself.  Objective: Increase ability to stop and think about the action she is doing to enhance self-control & motivation for self-care activities. -During the visit, [name] allowed herself to reflect on her own words and how it affects her behaviors, eg what she wants to control and letting go of things, using the analogy of her house falling down and letting the old fall down to rebuild a new house or mindset.       Target Date: 12/18/2024  Progress:  Ongoing     Gitty Osterlund P. Trudy,  MSW, LCSW [employer] Therapist Main Office: [phone]

## 2024-11-25 ENCOUNTER — Encounter: Payer: Self-pay | Admitting: Family

## 2024-11-25 NOTE — Telephone Encounter (Signed)
 Dr. Tanda, you have a full schedule on Tuesday morning. Can pt be DB? Pt is asking to be seen anywhere from 8am to 10am. Please advise.

## 2024-11-25 NOTE — Telephone Encounter (Signed)
 Please disregard. I will DB pt on your 9:20 DB slot.

## 2024-11-25 NOTE — Telephone Encounter (Signed)
 Noted.

## 2024-11-28 ENCOUNTER — Ambulatory Visit: Admitting: Orthopedic Surgery

## 2024-11-29 ENCOUNTER — Ambulatory Visit: Admitting: Family

## 2024-12-08 ENCOUNTER — Ambulatory Visit: Admitting: Clinical

## 2024-12-14 ENCOUNTER — Ambulatory Visit: Admitting: Family

## 2024-12-28 ENCOUNTER — Ambulatory Visit: Admitting: Family

## 2024-12-29 ENCOUNTER — Ambulatory Visit: Admitting: Clinical

## 2025-01-12 ENCOUNTER — Ambulatory Visit: Admitting: Clinical

## 2025-01-26 ENCOUNTER — Ambulatory Visit: Admitting: Clinical

## 2025-02-06 ENCOUNTER — Ambulatory Visit: Admitting: Dermatology

## 2025-02-09 ENCOUNTER — Ambulatory Visit: Admitting: Clinical

## 2025-02-22 ENCOUNTER — Encounter: Admitting: Family
